# Patient Record
Sex: Female | Born: 1970
Health system: Southern US, Community
[De-identification: ages and names within clinical notes are randomized; demographics above are authoritative.]

## PROBLEM LIST (undated history)

## (undated) DIAGNOSIS — D649 Anemia, unspecified: Secondary | ICD-10-CM

## (undated) DIAGNOSIS — Z8489 Family history of other specified conditions: Secondary | ICD-10-CM

## (undated) DIAGNOSIS — R112 Nausea with vomiting, unspecified: Secondary | ICD-10-CM

## (undated) DIAGNOSIS — C649 Malignant neoplasm of unspecified kidney, except renal pelvis: Secondary | ICD-10-CM

## (undated) DIAGNOSIS — Z9889 Other specified postprocedural states: Secondary | ICD-10-CM

## (undated) DIAGNOSIS — N926 Irregular menstruation, unspecified: Secondary | ICD-10-CM

## (undated) DIAGNOSIS — N9489 Other specified conditions associated with female genital organs and menstrual cycle: Secondary | ICD-10-CM

## (undated) DIAGNOSIS — K219 Gastro-esophageal reflux disease without esophagitis: Secondary | ICD-10-CM

## (undated) DIAGNOSIS — Z8679 Personal history of other diseases of the circulatory system: Secondary | ICD-10-CM

## (undated) DIAGNOSIS — J342 Deviated nasal septum: Secondary | ICD-10-CM

## (undated) DIAGNOSIS — H269 Unspecified cataract: Secondary | ICD-10-CM

## (undated) DIAGNOSIS — D179 Benign lipomatous neoplasm, unspecified: Secondary | ICD-10-CM

## (undated) DIAGNOSIS — Z87898 Personal history of other specified conditions: Secondary | ICD-10-CM

## (undated) DIAGNOSIS — F419 Anxiety disorder, unspecified: Secondary | ICD-10-CM

## (undated) DIAGNOSIS — D250 Submucous leiomyoma of uterus: Secondary | ICD-10-CM

## (undated) HISTORY — DX: Submucous leiomyoma of uterus: D25.0

## (undated) HISTORY — DX: Personal history of other specified conditions: Z87.898

## (undated) HISTORY — DX: Malignant neoplasm of unspecified kidney, except renal pelvis: C64.9

---
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
## 1995-08-19 HISTORY — PX: RHINOPLASTY: SUR1284

## 2000-12-11 ENCOUNTER — Other Ambulatory Visit: Admission: RE | Admit: 2000-12-11 | Discharge: 2000-12-11 | Payer: Self-pay | Admitting: *Deleted

## 2002-01-07 ENCOUNTER — Other Ambulatory Visit: Admission: RE | Admit: 2002-01-07 | Discharge: 2002-01-07 | Payer: Self-pay | Admitting: *Deleted

## 2003-02-10 ENCOUNTER — Other Ambulatory Visit: Admission: RE | Admit: 2003-02-10 | Discharge: 2003-02-10 | Payer: Self-pay | Admitting: *Deleted

## 2004-02-28 ENCOUNTER — Other Ambulatory Visit: Admission: RE | Admit: 2004-02-28 | Discharge: 2004-02-28 | Payer: Self-pay | Admitting: *Deleted

## 2005-01-23 HISTORY — PX: OTHER SURGICAL HISTORY: SHX169

## 2005-03-28 ENCOUNTER — Other Ambulatory Visit: Admission: RE | Admit: 2005-03-28 | Discharge: 2005-03-28 | Payer: Self-pay | Admitting: *Deleted

## 2006-04-24 ENCOUNTER — Other Ambulatory Visit: Admission: RE | Admit: 2006-04-24 | Discharge: 2006-04-24 | Payer: Self-pay | Admitting: Obstetrics and Gynecology

## 2007-06-29 ENCOUNTER — Other Ambulatory Visit: Admission: RE | Admit: 2007-06-29 | Discharge: 2007-06-29 | Payer: Self-pay | Admitting: Obstetrics & Gynecology

## 2008-07-07 ENCOUNTER — Other Ambulatory Visit: Admission: RE | Admit: 2008-07-07 | Discharge: 2008-07-07 | Payer: Self-pay | Admitting: Obstetrics & Gynecology

## 2010-08-18 HISTORY — PX: CATARACT EXTRACTION: SUR2

## 2012-06-17 LAB — HM PAP SMEAR

## 2012-12-08 ENCOUNTER — Telehealth: Payer: Self-pay | Admitting: Obstetrics & Gynecology

## 2012-12-08 NOTE — Telephone Encounter (Signed)
Patient given another 3 pack of LO LOESTRIN FE to try . States is only having a dark brown discharge off and on and wants to make sure this will go away before purchasing at pharmacy. Instructed patient on good hygiene of vaginal area. Samples of LO LOESTRIN FE PLACED  Up front to be picked up.

## 2012-12-08 NOTE — Telephone Encounter (Signed)
Patient had samples of Lo Loestrin FE/iwant to speak with nurse re: side effects before getting an rx//[facility] [address]

## 2013-01-13 ENCOUNTER — Encounter: Payer: Self-pay | Admitting: Obstetrics & Gynecology

## 2013-01-13 ENCOUNTER — Ambulatory Visit (INDEPENDENT_AMBULATORY_CARE_PROVIDER_SITE_OTHER): Payer: BC Managed Care – PPO | Admitting: Obstetrics & Gynecology

## 2013-01-13 VITALS — BP 118/72 | HR 74 | Resp 16 | Ht 60.0 in | Wt 120.0 lb

## 2013-01-13 DIAGNOSIS — Z0000 Encounter for general adult medical examination without abnormal findings: Secondary | ICD-10-CM

## 2013-01-13 DIAGNOSIS — Z124 Encounter for screening for malignant neoplasm of cervix: Secondary | ICD-10-CM

## 2013-01-13 DIAGNOSIS — Z01419 Encounter for gynecological examination (general) (routine) without abnormal findings: Secondary | ICD-10-CM | POA: Insufficient documentation

## 2013-01-13 LAB — POCT URINALYSIS DIPSTICK
Bilirubin, UA: NEGATIVE
Blood, UA: NEGATIVE
Nitrite, UA: NEGATIVE
pH, UA: 6

## 2013-01-13 MED ORDER — NORETHIN ACE-ETH ESTRAD-FE 1-20 MG-MCG PO TABS: 1.0000 | ORAL_TABLET | Freq: Every day | ORAL | Status: DC

## 2013-01-13 NOTE — Patient Instructions (Signed)

## 2013-01-13 NOTE — Progress Notes (Signed)
42 y.o. G0P0000 SingleCaucasianF here for annual exam.  Back together with ex-boyfriend.  Came in during January because she was having bleeding with intercourse.  STD testing was all negative.  Treated with doxycyline.  This didn't help so she ultimately started LoLoestrin.  Heavy bleeding has stopped but she has a fair amount of irregular spotting.  Only needs a light day pad when has spotting.  No bleeding, now, with intercourse.  No pain.  Does feel more dry with intercourse and feels like libido is decreased from the past.  This seems to have changed with the OCP she is taking.  Also wants to talk about anxiety she has experience for "forever".  She doesn't really want to be on any medication.  Questions is any herbal medication might help or therapy?  Patient's last menstrual period was 12/30/2012.          Sexually active: yes  The current method of family planning is oral progesterone-only contraceptive.    Exercising: no  Gym/ health club routine includes cardio. Smoker:  no  Health Maintenance: Pap:  06/17/12 History of abnormal Pap:  Yes MMG:  n/a Colonoscopy: n/a BMD:  n/a TDaP:  10/29/2012 Screening Labs: 14.2, Hb today: normal, Urine today: neg   reports that she has never smoked. She has never used smokeless tobacco. She reports that  drinks alcohol. She reports that she does not use illicit drugs.  Past Medical History  Diagnosis Date  . History of abnormal Pap smear 2/93    LEEP, CIN I    Past Surgical History  Procedure Laterality Date  . Leep  1993    CIN I  . Cataract extraction Left   . Colposcopy    . Cervical biopsy  w/ loop electrode excision      Current Outpatient Prescriptions  Medication Sig Dispense Refill  . Cholecalciferol (VITAMIN D PO) Take by mouth.      . Cyanocobalamin (B-12 PO) Take by mouth.      . Multiple Vitamin (MULTIVITAMIN) tablet Take 1 tablet by mouth daily.      . norethindrone-ethinyl estradiol (JUNEL FE,GILDESS FE,LOESTRIN FE)  1-20 MG-MCG tablet Take 1 tablet by mouth daily.       No current facility-administered medications for this visit.    Family History  Problem Relation Age of Onset  . Breast cancer Maternal Aunt   . Breast cancer Cousin     maternal  . Colon cancer Mother 64  . Colon cancer Maternal Grandmother   . Colon cancer Maternal Grandfather   . Cancer Maternal Uncle   . Cancer Paternal Grandfather     ROS:  Pertinent items are noted in HPI.  Otherwise, a comprehensive ROS was negative.  Exam:   BP 118/72  Pulse 74  Resp 16  Ht 5' (1.524 m)  Wt 120 lb (54.432 kg)  BMI 23.44 kg/m2  LMP 12/30/2012  Weight change: 5lbs Height:   Height: 5' (152.4 cm)  Ht Readings from Last 3 Encounters:  01/13/13 5' (1.524 m)    General appearance: alert, cooperative and appears stated age Head: Normocephalic, without obvious abnormality, atraumatic Neck: no adenopathy, supple, symmetrical, trachea midline and thyroid normal to inspection and palpation Lungs: clear to auscultation bilaterally Breasts: normal appearance, no masses or tenderness Heart: regular rate and rhythm Abdomen: soft, non-tender; bowel sounds normal; no masses,  no organomegaly Extremities: extremities normal, atraumatic, no cyanosis or edema Skin: Skin color, texture, turgor normal. No rashes or lesions Lymph nodes: Cervical,  supraclavicular, and axillary nodes normal. No abnormal inguinal nodes palpated Neurologic: Grossly normal   Pelvic: External genitalia:  no lesions              Urethra:  normal appearing urethra with no masses, tenderness or lesions              Bartholins and Skenes: normal                 Vagina: normal appearing vagina with normal color and discharge, no lesions              Cervix: no lesions              Pap taken: yes Bimanual Exam:  Uterus:  normal size, contour, position, consistency, mobility, non-tender              Adnexa: normal adnexa and no mass, fullness, tenderness                Rectovaginal: Confirms               Anus:  normal sphincter tone, no lesions  A:  Well Woman with normal exam H/o menorrhagia, improved with OPC, now spotting Strong family hx of colon cancer (mother age 47 and several other family members) Anxiety  H/O ASCUS pap with +HR HPV one year ago  P:   Mammogram starting at age 34.  Information given. pap smear with HR HPV obtained. Pt and I discussed St John's Wort vs therapy vs SSRI.  She will start with herbal medication. Change OCP to loestin 1/20.  Reviewed risks including DVT/PE with patient. return annually or prn  An After Visit Summary was printed and given to the patient.

## 2013-01-14 ENCOUNTER — Telehealth: Payer: Self-pay | Admitting: Obstetrics & Gynecology

## 2013-01-14 NOTE — Telephone Encounter (Signed)
RX Microgestin FE for patient costs nothing, no copay. Patient states Dr. Hyacinth Meeker needed this information and asked her to call.

## 2013-01-19 LAB — IPS PAP TEST WITH HPV

## 2013-01-24 ENCOUNTER — Telehealth: Payer: Self-pay

## 2013-01-24 NOTE — Telephone Encounter (Signed)
6/9 lmtcb//kn 

## 2013-01-24 NOTE — Telephone Encounter (Signed)
Patient notified of all results. 

## 2013-06-18 HISTORY — PX: TONSILLECTOMY: SUR1361

## 2014-02-10 ENCOUNTER — Ambulatory Visit (INDEPENDENT_AMBULATORY_CARE_PROVIDER_SITE_OTHER): Payer: BC Managed Care – PPO | Admitting: Obstetrics & Gynecology

## 2014-02-10 ENCOUNTER — Encounter: Payer: Self-pay | Admitting: Obstetrics & Gynecology

## 2014-02-10 VITALS — BP 104/60 | HR 68 | Resp 16 | Ht 60.25 in | Wt 121.0 lb

## 2014-02-10 DIAGNOSIS — Z124 Encounter for screening for malignant neoplasm of cervix: Secondary | ICD-10-CM

## 2014-02-10 DIAGNOSIS — N925 Other specified irregular menstruation: Secondary | ICD-10-CM

## 2014-02-10 DIAGNOSIS — N938 Other specified abnormal uterine and vaginal bleeding: Secondary | ICD-10-CM

## 2014-02-10 DIAGNOSIS — Z0000 Encounter for general adult medical examination without abnormal findings: Secondary | ICD-10-CM

## 2014-02-10 DIAGNOSIS — Z01419 Encounter for gynecological examination (general) (routine) without abnormal findings: Secondary | ICD-10-CM

## 2014-02-10 DIAGNOSIS — Z202 Contact with and (suspected) exposure to infections with a predominantly sexual mode of transmission: Secondary | ICD-10-CM

## 2014-02-10 DIAGNOSIS — N949 Unspecified condition associated with female genital organs and menstrual cycle: Secondary | ICD-10-CM

## 2014-02-10 DIAGNOSIS — R87610 Atypical squamous cells of undetermined significance on cytologic smear of cervix (ASC-US): Secondary | ICD-10-CM

## 2014-02-10 LAB — CBC
HEMATOCRIT: 40.9 % (ref 36.0–46.0)
Hemoglobin: 14.3 g/dL (ref 12.0–15.0)
MCH: 31.6 pg (ref 26.0–34.0)
MCHC: 35 g/dL (ref 30.0–36.0)
MCV: 90.3 fL (ref 78.0–100.0)
Platelets: 241 10*3/uL (ref 150–400)
RBC: 4.53 MIL/uL (ref 3.87–5.11)
RDW: 13.7 % (ref 11.5–15.5)
WBC: 9.2 10*3/uL (ref 4.0–10.5)

## 2014-02-10 LAB — LIPID PANEL
Cholesterol: 219 mg/dL — ABNORMAL HIGH (ref 0–200)
HDL: 80 mg/dL (ref >39)
LDL CALC: 123 mg/dL — AB (ref 0–99)
TRIGLYCERIDES: 79 mg/dL (ref <150)
Total CHOL/HDL Ratio: 2.7 Ratio
VLDL: 16 mg/dL (ref 0–40)

## 2014-02-10 LAB — POCT URINALYSIS DIPSTICK
Bilirubin, UA: NEGATIVE
Glucose, UA: NEGATIVE
Ketones, UA: NEGATIVE
Leukocytes, UA: NEGATIVE
Nitrite, UA: NEGATIVE
PROTEIN UA: NEGATIVE
RBC UA: NEGATIVE
UROBILINOGEN UA: NEGATIVE
pH, UA: 5

## 2014-02-10 MED ORDER — FLUCONAZOLE 150 MG PO TABS: 150.0000 mg | ORAL_TABLET | Freq: Once | ORAL | Status: DC

## 2014-02-10 NOTE — Patient Instructions (Signed)

## 2014-02-10 NOTE — Progress Notes (Signed)
43 y.o. G0P0000 SingleCaucasianF here for annual exam.  Significant other moved to [place] and they broke up.  Not dating anyone or SA right now.  He moved six weeks ago.  Stopped OCPs last year after she had tonsillectomy--November 2014.  Now having a little spotting.  Flow is a week with some spotting.  When on OCP, flow was was shorter.  Spotting stopped.  Feels better off pills.  Not sure if she wants to restart.   Patient's last menstrual period was 02/02/2014.          Sexually active: Yes.    The current method of family planning is condoms 100% of the time.    Exercising: Yes.    strength training and cardio Smoker:  no  Health Maintenance: Pap:  01/13/13 WNL/negative HR HPV History of abnormal Pap:  Yes h/o CIN I/II. 1993 MMG:  none Colonoscopy:  none BMD:   none TDaP:  2008 will check with PCP to be sure Screening Labs: today, Hb today: today  Urine today: negative   reports that she has never smoked. She has never used smokeless tobacco. She reports that she drinks about 1 - 1.5 ounces of alcohol per week. She reports that she does not use illicit drugs.  Past Medical History  Diagnosis Date  . History of abnormal Pap smear 2/93    LEEP, CIN I    Past Surgical History  Procedure Laterality Date  . Leep  1993    CIN I  . Cataract extraction Left 2012  . Tonsillectomy  11/14    Current Outpatient Prescriptions  Medication Sig Dispense Refill  . Cholecalciferol (VITAMIN D PO) Take by mouth.      . Cyanocobalamin (B-12 PO) Take by mouth.      . Multiple Vitamin (MULTIVITAMIN) tablet Take 1 tablet by mouth daily.      . norethindrone-ethinyl estradiol (JUNEL FE,GILDESS FE,LOESTRIN FE) 1-20 MG-MCG tablet Take 1 tablet by mouth daily.  1 Package  13   No current facility-administered medications for this visit.    Family History  Problem Relation Age of Onset  . Breast cancer Maternal Aunt   . Breast cancer Cousin     maternal  . Colon cancer Mother 64  . Colon  cancer Maternal Grandmother   . Colon cancer Maternal Grandfather   . Colon cancer Maternal Uncle   . Lung cancer Paternal Grandfather     ROS:  Pertinent items are noted in HPI.  Otherwise, a comprehensive ROS was negative.  Exam:   BP 104/60  Pulse 68  Resp 16  Ht 5' 0.25" (1.53 m)  Wt 121 lb (54.885 kg)  BMI 23.45 kg/m2  LMP 02/02/2014  Weight change: -1#   Height: 5' 0.25" (153 cm)  Ht Readings from Last 3 Encounters:  02/10/14 5' 0.25" (1.53 m)  01/13/13 5' (1.524 m)    General appearance: alert, cooperative and appears stated age Head: Normocephalic, without obvious abnormality, atraumatic Neck: no adenopathy, supple, symmetrical, trachea midline and thyroid normal to inspection and palpation Lungs: clear to auscultation bilaterally Breasts: normal appearance, no masses or tenderness Heart: regular rate and rhythm Abdomen: soft, non-tender; bowel sounds normal; no masses,  no organomegaly Extremities: extremities normal, atraumatic, no cyanosis or edema Skin: Skin color, texture, turgor normal. No rashes or lesions Lymph nodes: Cervical, supraclavicular, and axillary nodes normal. No abnormal inguinal nodes palpated Neurologic: Grossly normal   Pelvic: External genitalia:  no lesions  Urethra:  normal appearing urethra with no masses, tenderness or lesions              Bartholins and Skenes: normal                 Vagina: normal appearing vagina with normal color and discharge, no lesions              Cervix: no lesions              Pap taken: Yes.   Bimanual Exam:  Uterus:  normal size, contour, position, consistency, mobility, non-tender              Adnexa: normal adnexa and no mass, fullness, tenderness               Rectovaginal: Confirms               Anus:  normal sphincter tone, no lesions  A:  Well Woman with normal exam  H/o menorrhagia improved with OCPs.  Off since 11/14. Strong family hx of colon cancer (mother age 57 and several other  family members)  Anxiety  H/O ASCUS pap with +HR HPV 2013.  Neg pap with neg HR HPV 2014. Recurrent yeast due to exercise   P: Mammogram starting at age 77. Information given.  pap smear only today with GC/Chl off pap CBC, lipids, CMP, TSH, Vit D Plan SHGM right after cycle Diflucan 150mg  po x 1, repeat 72.  #2/3RF for recurrent yeast infection. return annually or prn  An After Visit Summary was printed and given to the patient.

## 2014-02-11 LAB — VITAMIN D 25 HYDROXY (VIT D DEFICIENCY, FRACTURES): Vit D, 25-Hydroxy: 65 ng/mL (ref 30–89)

## 2014-02-11 LAB — TSH: TSH: 1.439 u[IU]/mL (ref 0.350–4.500)

## 2014-02-13 LAB — THYROID PEROXIDASE ANTIBODY

## 2014-02-14 ENCOUNTER — Telehealth: Payer: Self-pay

## 2014-02-14 LAB — IPS PAP SMEAR ONLY

## 2014-02-14 LAB — IPS N GONORRHOEA AND CHLAMYDIA BY PCR

## 2014-02-14 NOTE — Telephone Encounter (Signed)
Message copied by Robley Fries on Tue Feb 14, 2014  9:19 AM ------      Message from: Megan Salon      Created: Mon Feb 13, 2014  6:07 PM       Inform cholestrol is mildly elevated and LDLs are as well but ratio is fine and HDLs are great.  Thyroid is fine.  Vit D normal.  CBC normal. ------

## 2014-02-14 NOTE — Telephone Encounter (Signed)
Patient returning Kelly's call. °

## 2014-02-14 NOTE — Telephone Encounter (Signed)
Patient notified of all results.//kn 

## 2014-02-14 NOTE — Telephone Encounter (Signed)
Lmtcb//kn 

## 2014-02-15 ENCOUNTER — Telehealth: Payer: Self-pay | Admitting: Obstetrics & Gynecology

## 2014-02-15 NOTE — Telephone Encounter (Signed)
Left message for patient to call back. Need to go over benefits and schedule SHGM. °

## 2014-02-15 NOTE — Telephone Encounter (Signed)
Spoke with patient. She is to schedule Sonohysterogram with Dr. Sabra Heck for directly after cycle.  She is not currently on birth control.  She plans to start her next cycle around 7/20. She states she usually has some light spotting and then a flow starts. Advised patient to call with first bleeding and then we can schedule for Sonohysterogram with Dr. Sabra Heck.  She is agreeable to this.  She is given information from [place] regarding her responsibility for cost to be collected at time of appointment is $20.00.  She will call with first day of menses and can be scheduled for Sonohysterogram with Dr. Sabra Heck.   Will wait for patient to return call for scheduling.  Will close encounter.

## 2014-02-15 NOTE — Addendum Note (Signed)
Addended by: Megan Salon on: 02/15/2014 06:38 AM   Modules accepted: Orders

## 2014-02-16 LAB — IPS HPV ON A LIQUID BASED SPECIMEN

## 2014-02-20 ENCOUNTER — Telehealth: Payer: Self-pay

## 2014-02-20 NOTE — Telephone Encounter (Signed)
Message copied by Robley Fries on Mon Feb 20, 2014 11:20 AM ------      Message from: Megan Salon      Created: Mon Feb 20, 2014  6:13 AM       Inform pap ASCUS with neg HR HPV.  This is regarded as normal.  No colposcopy needed.  Repeat one year.  02 recall.  GC/CHL normal. ------

## 2014-02-20 NOTE — Telephone Encounter (Signed)
Lmtcb//kn 

## 2014-02-21 NOTE — Telephone Encounter (Signed)
Patient notified of results.//kn 

## 2014-02-28 ENCOUNTER — Telehealth: Payer: Self-pay | Admitting: Obstetrics & Gynecology

## 2014-02-28 DIAGNOSIS — N938 Other specified abnormal uterine and vaginal bleeding: Secondary | ICD-10-CM

## 2014-02-28 NOTE — Telephone Encounter (Signed)
Spoke with patient. She started her cycle on 02/27/14 and thinks she will be done bleeding by 03/02/14. Really would like to have this Thursday appointment as this will work out for her best because she is planning to be off.  I advised that I have sent a message to Dr. Sabra Heck to ensure that this date will be okay for Sonohysterogram. I would like to ensure this is okay with Dr. Sabra Heck. Patient is scheduled for 03/02/14 at 1130 and advised would call back to advise if okay to keep appointment or need to r/s according to Dr. Sabra Heck.  Okay to use 1130 SHGM per Lamont Snowball.

## 2014-02-28 NOTE — Telephone Encounter (Signed)
That's fine with me. Thanks

## 2014-02-28 NOTE — Telephone Encounter (Signed)
Detailed message left to advise patient of message from Dr. Sabra Heck. Detailed message okay per designated party release form.  Advised will need 72 hours notice prior to cancellation. Within that time frame now.  pre-procedure instructions given. Motrin instructions given. Motrin=Advil=Ibuprofen Can take 800 mg (Can purchase over the counter, you will need four 200 mg pills). Take with food. Make sure to eat a meal and drink fluids prior to appointment.

## 2014-02-28 NOTE — Telephone Encounter (Signed)
Patient calling to let us know she started her cycle yesterday so we can schedule her PUS

## 2014-02-28 NOTE — Telephone Encounter (Addendum)
Returning call to patient.  Note from Dr. Sabra Heck from 02/10/14: Plan SHGM right after cycle Will confirm LMP and schedule SHGM according to cycle.   Dr. Sabra Heck, if was 02/27/14, do you want patient to have [facility] when you are in office after cycle in August?

## 2014-03-02 ENCOUNTER — Encounter: Payer: Self-pay | Admitting: Obstetrics & Gynecology

## 2014-03-02 ENCOUNTER — Ambulatory Visit (INDEPENDENT_AMBULATORY_CARE_PROVIDER_SITE_OTHER): Payer: BC Managed Care – PPO

## 2014-03-02 ENCOUNTER — Other Ambulatory Visit: Payer: Self-pay | Admitting: Obstetrics & Gynecology

## 2014-03-02 ENCOUNTER — Ambulatory Visit (INDEPENDENT_AMBULATORY_CARE_PROVIDER_SITE_OTHER): Payer: BC Managed Care – PPO | Admitting: Obstetrics & Gynecology

## 2014-03-02 VITALS — BP 112/74 | HR 68 | Ht 60.0 in | Wt 119.0 lb

## 2014-03-02 DIAGNOSIS — N925 Other specified irregular menstruation: Secondary | ICD-10-CM

## 2014-03-02 DIAGNOSIS — N938 Other specified abnormal uterine and vaginal bleeding: Secondary | ICD-10-CM

## 2014-03-02 DIAGNOSIS — N949 Unspecified condition associated with female genital organs and menstrual cycle: Secondary | ICD-10-CM

## 2014-03-02 MED ORDER — NORETHINDRONE 0.35 MG PO TABS: 1.0000 | ORAL_TABLET | Freq: Every day | ORAL | Status: DC

## 2014-03-02 NOTE — Progress Notes (Signed)
43 y.o.Singlefemale here for a pelvic ultrasound with sonohystogram due to spotting after her cycle and mid cycle spotting.  When pt in on estrogen containing OCPs, she is fine and cycles very normally.  However, she has a lot of fatigue with OCPs and mild nausea, even if she takes the pills at night.  She would prefer those symptoms over the spotting as it is really annoying.  Here for sonohysterography for further evaluation.  Patient's last menstrual period was 02/27/2014.  Contraception: condoms  Technique:  Both transabdominal and transvaginal ultrasound examinations of the pelvis were performed. Transabdominal technique was performed for global imaging of the pelvis including uterus, ovaries, adnexal regions, and pelvic cul-de-sac.  It was necessary to proceed with endovaginal exam following the abdominal ultrasound transabdominal exam to visualize the endometrium and adnexa.  Color and duplex Doppler ultrasound was utilized to evaluate blood flow to the ovaries.    FINDINGS: Uterus: 6.2 x 4.2 x 4.0cm Endometrium: 4.7mm Adnexa:  Left: 3.0 x 2.1 x 1.2cm     Right: 3.2 x 2.4 x 1.7cm Cul de sac:  No free fluid  SHSG:  After obtaining appropriate verbal consent from patient, the cervix was visualized using a speculum, and prepped with betadine.  A tenaculum  was applied to the cervix.  Dilation of the cervix was not necessary. The catheter was passed into the uterus and sterile saline introduced, with the following findings: no intracavitary lesions  Images reviewed with pt.  I was really expecting a polyp to be present but there really doesn't appear to be one.  Feel hysteroscopy and D&C is excessive for current problem.  D/W pt trial of progesterone only pills.  Adminstration and side effects d/w pt.  She is aware will need three month adjustment period.  Also aware amenorrhea is possible side effect.  She would be fine with this.  All questions answered.    Assessment:  DUB, with spotting,  o/w regular cycles, normal shgm today  Plan:  Trial of micronor.  Rx to pharmacy.  Pt will call with any concerns or side effects.  ~25 minutes spent with patient >50% of time was in face to face discussion of above.

## 2014-03-02 NOTE — Addendum Note (Signed)
Addended by: Michele Mcalpine on: 03/02/2014 10:51 AM   Modules accepted: Orders

## 2014-03-22 ENCOUNTER — Telehealth: Payer: Self-pay | Admitting: Obstetrics & Gynecology

## 2014-03-22 ENCOUNTER — Encounter: Payer: Self-pay | Admitting: Gynecology

## 2014-03-22 ENCOUNTER — Ambulatory Visit (INDEPENDENT_AMBULATORY_CARE_PROVIDER_SITE_OTHER): Payer: BC Managed Care – PPO | Admitting: Gynecology

## 2014-03-22 VITALS — BP 118/68 | HR 80 | Ht 60.25 in | Wt 121.0 lb

## 2014-03-22 DIAGNOSIS — Z800 Family history of malignant neoplasm of digestive organs: Secondary | ICD-10-CM

## 2014-03-22 DIAGNOSIS — Z1239 Encounter for other screening for malignant neoplasm of breast: Secondary | ICD-10-CM

## 2014-03-22 DIAGNOSIS — Z803 Family history of malignant neoplasm of breast: Secondary | ICD-10-CM

## 2014-03-22 DIAGNOSIS — R222 Localized swelling, mass and lump, trunk: Secondary | ICD-10-CM

## 2014-03-22 NOTE — Telephone Encounter (Signed)
Tried to reach patient at work number provided but was unable to get through. Tried to reach patient at cell phone number provided but there was no answer and the voice mailbox is full at this time. Will try to reach patient again.

## 2014-03-22 NOTE — Telephone Encounter (Signed)
Returning a call to Barry. Please call work number.

## 2014-03-22 NOTE — Telephone Encounter (Signed)
Pt has a swollen lymph node under her collar bone on the left side of her breast. She would like to come in today to be seen.

## 2014-03-22 NOTE — Telephone Encounter (Signed)
Spoke with patient. Advised spoke with Dr.Lathrop and patient can be worked in today at 1:45pm. Patient agreeable to date and time.  Routing to Dr.Lathrop as covering Cc: Dr.Miller  Routing to provider for final review. Patient agreeable to disposition. Will close encounter

## 2014-03-22 NOTE — Progress Notes (Signed)
Ordered from Dr. Charlies Constable for Bilateral dx MMG (patient has not had baseline) and L Breast Ultrasound. Patient has Mass on L Chest wall near clavicle. Spoke with Ena Dawley at [facility] She states that a screening will need to be ordered and an ultrasound done at [facility].  Discussed with Dr. Karle Starch received. L Chest wall ultrasound at [facility] [place]. [address]  3D Screening Mammogram at [facility].   Patient scheduled for L Chest Wall Ultrasound at [facility] for 03/24/14.  Patient scheduled for Bilateral 3D screening Mammogram at [facility] for 03/31/14. She is notified of $50.00 cost for 3D Mammogram.

## 2014-03-22 NOTE — Progress Notes (Signed)
Subjective:     Patient ID: Sarah George, female   DOB: 17-May-1971, 43 y.o.   MRN: [id number]  HPI Comments: Pt here after having a mass on left "breast" noted during a massage.  Pt does regular breast exams.  Pt has never had a mammogram.  Pt has a strong family history of breast and colon cancer on maternal side.  No genetic testing.  Materna cousin died in 56's.    Review of Systems Per HPI    Objective:   Physical Exam  Nursing note and vitals reviewed. Constitutional: She is oriented to person, place, and time. She appears well-developed and well-nourished.  Pulmonary/Chest: Right breast exhibits no inverted nipple, no mass, no nipple discharge, no skin change and no tenderness. Left breast exhibits no inverted nipple, no mass, no nipple discharge, no skin change and no tenderness.    Neurological: She is alert and oriented to person, place, and time.  Skin: Skin is warm and dry.       Assessment:     Left chest wall lesion-probable lipoma Strong family history of breast/colon cancer     Plan:     Will get bilateral 3D mammogram-dense breast tissue U/s of area of concern-superior to breast tissue To consider genetic testing, may affect screening protocols for her-pt agrees      43m spent counseling re cancer risks, >50% face to face

## 2014-03-22 NOTE — Telephone Encounter (Signed)
Spoke with patient. Patient states that last Friday she was getting a massage and it was noticed that she has a swollen area below her collar bone that extends close to her breast. "It feels like there is fluid in there or something." Patient denies pain. Area has not grown in size since last Friday but has not decreased in size either. Patient states she has not had a mammogram yet since turning 42. Patient would like to come in to be seen today for evaluation to "makre sure everything is okay." Advised patient would speak with one of the covering providers and see what the best date and time would be to get her in for evaluation. Patient is agreeable and would like return call to work number.

## 2014-03-24 ENCOUNTER — Ambulatory Visit
Admission: RE | Admit: 2014-03-24 | Discharge: 2014-03-24 | Disposition: A | Discharge status: Home / Self Care | Payer: BC Managed Care – PPO | Source: Ambulatory Visit | Attending: Gynecology | Admitting: Gynecology

## 2014-03-24 ENCOUNTER — Telehealth: Payer: Self-pay | Admitting: *Deleted

## 2014-03-24 DIAGNOSIS — D179 Benign lipomatous neoplasm, unspecified: Secondary | ICD-10-CM

## 2014-03-24 DIAGNOSIS — R222 Localized swelling, mass and lump, trunk: Secondary | ICD-10-CM

## 2014-03-24 HISTORY — DX: Benign lipomatous neoplasm, unspecified: D17.9

## 2014-03-24 NOTE — Telephone Encounter (Signed)
Patient notified see result note 

## 2014-03-24 NOTE — Telephone Encounter (Signed)
Message copied by Alfonzo Feller on Fri Mar 24, 2014  4:01 PM ------      Message from: Elveria Rising      Created: Fri Mar 24, 2014  3:50 PM       Inform lipoma as suspected on chest wall ------

## 2014-03-27 ENCOUNTER — Ambulatory Visit
Admission: RE | Admit: 2014-03-27 | Discharge: 2014-03-27 | Disposition: A | Discharge status: Home / Self Care | Payer: BC Managed Care – PPO | Source: Ambulatory Visit | Attending: Gynecology | Admitting: Gynecology

## 2014-03-27 DIAGNOSIS — Z1239 Encounter for other screening for malignant neoplasm of breast: Secondary | ICD-10-CM

## 2014-03-31 ENCOUNTER — Ambulatory Visit: Payer: Self-pay

## 2014-04-05 ENCOUNTER — Telehealth: Payer: Self-pay | Admitting: Obstetrics & Gynecology

## 2014-04-05 NOTE — Telephone Encounter (Signed)
Call to patient to advise that she is scheduled for genetic testing at [facility] on Wed Aug 26 @ 1230. No voicemail available

## 2014-04-10 NOTE — Telephone Encounter (Signed)
Call to patient to advise that she is scheduled for genetic testing at [facility] on Wed Aug 26 @ 1230. No voicemail available

## 2014-04-12 ENCOUNTER — Other Ambulatory Visit: Payer: BC Managed Care – PPO

## 2014-04-13 NOTE — Telephone Encounter (Signed)
Spoke with patient. She is having frequent urination and feels a lot of pressure in lower pelvic area with bloating x 2 days. Patient wondering if this may be uti as she has not had one since she was a teenager.  She has been traveling all week and is on her way to [place]. Patient denies dysuria, fevers, flank pain, nausea and vomiting.  Advised patient that she will need to be seen at local urgent care for evaluation as as will need to have a urinalysis to evaluate for uti and possible urine culture. Advised to increase fluids. Patient states she is on new ocp x 1 month and feeling bloating with new ocp. Patient feels she may watch symptoms and seek evaluation if symptoms change. Advised patient if symptoms increase to not wait to seek care, to be seen out of town. Patient verbalized understanding of instructions and will call back as needed.  Routing to provider for final review. Patient agreeable to disposition. Will close encounter

## 2014-04-13 NOTE — Telephone Encounter (Signed)
Pt calling saying she thinks she has a UTI. Symptoms are frequent urination,  pressure in lower abdomin, and bloated. Pt is out of town until Monday. Wants to know if we would call her in a medication. Advised pt that we normally would need pt to come in for an appt to correctly diagnose to give right meds. Pt agreeable but still wanted me to send message to see. Pt says this is not reoccurring has not had one since she was a teenager.

## 2014-05-17 ENCOUNTER — Telehealth: Payer: Self-pay | Admitting: Obstetrics & Gynecology

## 2014-05-17 NOTE — Telephone Encounter (Signed)
Attempted to reach patient at mobile number provided. There was no answer and the mailbox is currently full. Will try again.

## 2014-05-17 NOTE — Telephone Encounter (Signed)
Pt has a question regarding her birth control.

## 2014-05-18 NOTE — Telephone Encounter (Signed)
I would suggest LoLoEstrin.  (Same as the Microgestin just lower dose.) OK to refill until annual exam due in July 2015.  I saw that she has nausea when she takes combined oral contraception, so lowering the dose of the pill with estrogen may really help this. If patient is currently on her cycle, have her just switch to the new pills. Use back up protection for the first month.  If patient wants to go back on her prior dosage of Microgestin, let me know.

## 2014-05-18 NOTE — Telephone Encounter (Signed)
Spoke with patient. Patient states that she is currently in her third month of taking micronor and is about half way through. Patient states that since the second month she has been getting acne and is currently bleeding. Patient would like to swtich back to microgestin which she states she was previously on after loestrin. "I would rather be on something that doesn't cause me to have acne.I am open to suggestions." Advised patient would send a message over to covering provider as Dr.Miller is out of the office today and give her a call back with further recommendations and instructions. Patient is agreeable. Patient would also like clarification if she is going to switch OCP and is currently on her cycle if she could just switch over or if she needs to complete pack. Advised would need to speak with provider first about switching before any further instructions can be given. Patient is agreeable.

## 2014-05-19 MED ORDER — NORETHIN-ETH ESTRAD-FE BIPHAS 1 MG-10 MCG / 10 MCG PO TABS: 1.0000 | ORAL_TABLET | Freq: Every day | ORAL | Status: DC

## 2014-05-19 NOTE — Telephone Encounter (Signed)
Left message to call Kaitlyn at [phone]. 

## 2014-05-19 NOTE — Telephone Encounter (Signed)
Spoke with patient. Patient is agreeable to start on the Loloestrin at this time. Patient is currently on cycle and will begin new pill today will use BUM for first month. Rx placed for Loloestrin to pharmacy on file for #3 2RF.  Routing to provider for final review. Patient agreeable to disposition. Will close encounter

## 2014-07-26 ENCOUNTER — Telehealth: Payer: Self-pay | Admitting: Emergency Medicine

## 2014-07-26 NOTE — Telephone Encounter (Signed)
-----   Message from Elveria Rising, MD sent at 05/29/2014  8:12 AM EDT ----- Regarding: RE: Mammogram hold  yes ----- Message -----    From: Karen Chafe, RN    Sent: 05/22/2014   3:34 PM      To: Azalia Bilis, MD Subject: Mammogram hold                                 Okay to remove from mammogram hold? Had negative breast imaging and negative chest ultrasound.

## 2014-08-28 ENCOUNTER — Telehealth: Payer: Self-pay | Admitting: Obstetrics & Gynecology

## 2014-08-28 NOTE — Telephone Encounter (Signed)
Spoke with patient. Advised no generic available for Loloestrin Fe. Advised savings cards are available that will make rx no more then $25 with insurance. Patient is agreeable. "I was on a birth control before that was free but really was not working for me. This one has been working so I want to stay on it." Patient will print savings card online and take to pharmacy. Will call with any questions.  Routing to provider for final review. Patient agreeable to disposition. Will close encounter

## 2014-08-28 NOTE — Telephone Encounter (Signed)
Pt is wondering if the loloestrin birth control comes in a generic.

## 2014-09-19 ENCOUNTER — Telehealth: Payer: Self-pay | Admitting: Obstetrics & Gynecology

## 2014-09-19 NOTE — Telephone Encounter (Signed)
Left message to call Kaitlyn at [phone]. 

## 2014-09-19 NOTE — Telephone Encounter (Signed)
Spoke with patient. Patient states that she has been on Loloestrin for 4 months now. "The first two months I had spotting the whole time. Last month was the best month. I had spotting for 5 days and then bleeding for two. This past month was the worst. I had heavy dark brown spotting and I felt terrible. I was so tired and my emotions were all over the place. I just don't feel like this pill is right for me. At one point I was on a birth control that was one step up in estrogen and it worked really well for me." Patient has been on Micronor in the past which she states stopped her break through bleeding but caused her to have acne and bloating. "I think maybe if I went to the Loestrin it may make a difference." Offered patient appointment to discuss birth control with provider but patient declines. Advised would send a message to [place] and return call with further recommendations. Patient is agreeable.

## 2014-09-19 NOTE — Telephone Encounter (Signed)
Pt would like a call to discuss changing type of birth control - a different kind of pill due to breakthrough bleeding and states she'd like one with a little more estrogen.  Pt requests call to work number.

## 2014-09-19 NOTE — Telephone Encounter (Signed)
OK to change to loestrin 1/20.  This is the pill she mentions in her triage note.  OK to RF through [facility]EX and pt can go right into this pack after she finishes current one.  Thanks.

## 2014-09-20 MED ORDER — NORETHINDRONE ACET-ETHINYL EST 1-20 MG-MCG PO TABS: 1.0000 | ORAL_TABLET | Freq: Every day | ORAL | Status: DC

## 2014-09-20 NOTE — Telephone Encounter (Signed)
Spoke with patient. Advised patient of message as seen below from [place]. Patient is agreeable. Prescription sent to pharmacy on file for Loestrin 1/20 #3 1RF until aex. Patient is agreeable.   Routing to provider for final review. Patient agreeable to disposition. Will close encounter

## 2014-09-20 NOTE — Telephone Encounter (Signed)
Left message to call Zannah Melucci at [phone]. 

## 2015-02-23 ENCOUNTER — Ambulatory Visit (INDEPENDENT_AMBULATORY_CARE_PROVIDER_SITE_OTHER): Payer: BLUE CROSS/BLUE SHIELD | Admitting: Obstetrics & Gynecology

## 2015-02-23 ENCOUNTER — Encounter: Payer: Self-pay | Admitting: Obstetrics & Gynecology

## 2015-02-23 VITALS — BP 120/66 | HR 72 | Resp 16 | Ht 59.5 in | Wt 121.0 lb

## 2015-02-23 DIAGNOSIS — Z01419 Encounter for gynecological examination (general) (routine) without abnormal findings: Secondary | ICD-10-CM | POA: Diagnosis not present

## 2015-02-23 DIAGNOSIS — Z0000 Encounter for general adult medical examination without abnormal findings: Secondary | ICD-10-CM

## 2015-02-23 DIAGNOSIS — Z124 Encounter for screening for malignant neoplasm of cervix: Secondary | ICD-10-CM

## 2015-02-23 LAB — POCT URINALYSIS DIPSTICK
Bilirubin, UA: NEGATIVE
Blood, UA: NEGATIVE
Glucose, UA: NEGATIVE
KETONES UA: NEGATIVE
Nitrite, UA: NEGATIVE
PROTEIN UA: NEGATIVE
UROBILINOGEN UA: NEGATIVE
pH, UA: 5

## 2015-02-23 MED ORDER — FLUCONAZOLE 150 MG PO TABS: 150.0000 mg | ORAL_TABLET | Freq: Once | ORAL | Status: DC

## 2015-02-23 MED ORDER — NORETHINDRONE ACET-ETHINYL EST 1-20 MG-MCG PO TABS: 1.0000 | ORAL_TABLET | Freq: Every day | ORAL | Status: DC

## 2015-02-23 NOTE — Progress Notes (Signed)
44 y.o. G0P0000 SingleCaucasianF here for annual exam.  Work is much more stressful in the summer as kids are out of school.  Cycles are regular.  Flow is very light with just brownish spotting with bleeding for two days.  Occasionally just has two days of bleeding without the spotting.    No new sexual partner this past year.  Had STD testing last year.   Patient's last menstrual period was 02/02/2015 (approximate).          Sexually active: Yes.    The current method of family planning is OCP (estrogen/progesterone).    Exercising: Yes.    cardio and strength training Smoker:  no  Health Maintenance: Pap:  02/10/14 ASCUS/negative HR HPV History of abnormal Pap:  yes MMG:  03/27/14-normal Colonoscopy:  none BMD:   none TDaP:  ? 2008 will check with PCP or work Screening Labs: 2015 , Hb today: n/a, Urine today: WBC-trace   reports that she has never smoked. She has never used smokeless tobacco. She reports that she drinks about 0.6 - 1.2 oz of alcohol per week. She reports that she does not use illicit drugs.  Past Medical History  Diagnosis Date  . History of abnormal Pap smear 2/93    LEEP, CIN I    Past Surgical History  Procedure Laterality Date  . Leep  1993    CIN I  . Cataract extraction Left 2012  . Tonsillectomy  11/14    Current Outpatient Prescriptions  Medication Sig Dispense Refill  . Cholecalciferol (VITAMIN D PO) Take 1,000 Int'l Units by mouth.     . Cyanocobalamin (B-12 PO) Take by mouth.    . Multiple Vitamin (MULTIVITAMIN) tablet Take 1 tablet by mouth daily.    . norethindrone-ethinyl estradiol (MICROGESTIN,JUNEL,LOESTRIN) 1-20 MG-MCG tablet Take 1 tablet by mouth daily. 3 Package 1   No current facility-administered medications for this visit.    Family History  Problem Relation Age of Onset  . Breast cancer Maternal Aunt   . Breast cancer Cousin     maternal  . Colon cancer Mother 49  . Colon cancer Maternal Grandmother   . Colon cancer Maternal  Grandfather   . Colon cancer Maternal Uncle   . Lung cancer Paternal Grandfather     ROS:  Pertinent items are noted in HPI.  Otherwise, a comprehensive ROS was negative.  Exam:   General appearance: alert, cooperative and appears stated age Head: Normocephalic, without obvious abnormality, atraumatic Neck: no adenopathy, supple, symmetrical, trachea midline and thyroid normal to inspection and palpation Lungs: clear to auscultation bilaterally Breasts: normal appearance, no masses or tenderness Heart: regular rate and rhythm Abdomen: soft, non-tender; bowel sounds normal; no masses,  no organomegaly Extremities: extremities normal, atraumatic, no cyanosis or edema Skin: Skin color, texture, turgor normal. No rashes or lesions Lymph nodes: Cervical, supraclavicular, and axillary nodes normal. No abnormal inguinal nodes palpated Neurologic: Grossly normal   Pelvic: External genitalia:  no lesions              Urethra:  normal appearing urethra with no masses, tenderness or lesions              Bartholins and Skenes: normal                 Vagina: normal appearing vagina with normal color and discharge, no lesions              Cervix: no lesions  Pap taken: Yes.   Bimanual Exam:  Uterus:  normal size, contour, position, consistency, mobility, non-tender              Adnexa: normal adnexa and no mass, fullness, tenderness               Rectovaginal: Confirms               Anus:  normal sphincter tone, no lesions  Chaperone was present for exam.  A:  Well woman with normal exam  H/o menorrhagia improved with OCP Strong family hx of colon cancer (mother age 39 and several other family members)  Anxiety  H/O ASCUS pap with +HR HPV 2013. Neg pap with neg HR HPV 2014.  ASCUS pap 2015.   H/O recurrent yeast during summer months  P: Mammogram yearly. pap smear with HR HPV testing today Loestrin 1/20 rx to pharmacy.   Diflucan 150mg  po x 1, repeat 48 hrs if needed.   #3RF. Pt will check with PCP regarding tdap return annually or prn

## 2015-02-27 LAB — IPS PAP TEST WITH HPV

## 2015-03-05 ENCOUNTER — Telehealth: Payer: Self-pay

## 2015-03-05 NOTE — Telephone Encounter (Signed)
Lmtcb//kn 

## 2015-03-05 NOTE — Telephone Encounter (Signed)
-----   Message from Megan Salon, MD sent at 02/28/2015  9:43 AM EDT ----- Inform pap negative and HR HPV negative.  Last year ASCUS with neg HR HPV.  2014 neg pap with neg HR HPV.  02 recall.

## 2015-03-12 NOTE — Telephone Encounter (Signed)
Patient notified of results. See result note.//kn

## 2015-11-06 HISTORY — PX: OTHER SURGICAL HISTORY: SHX169

## 2016-04-27 ENCOUNTER — Other Ambulatory Visit: Payer: Self-pay | Admitting: Obstetrics & Gynecology

## 2016-04-28 NOTE — Telephone Encounter (Signed)
Medication refill request: Norethindrone-Ethinyl Estradiol Last AEX:  02/23/15 SM Next AEX: 05/23/16 SM Last MMG (if hormonal medication request): 03/27/14 BIRADS1 Refill authorized: 02/23/15 #3 Packages 4R. Please advise. Thank you.

## 2016-04-29 NOTE — Telephone Encounter (Signed)
Patient called to check on the status of the refill request. She declined to move her appointment up due to her schedule. This patient is completely out of her birth control medication and hopes it will be called in today.

## 2016-04-29 NOTE — Telephone Encounter (Signed)
Dr. Sabra Heck, please see note from patient below. Thanks.

## 2016-05-22 NOTE — Progress Notes (Signed)
45 y.o. G0P0000 SingleCaucasianF here for annual exam.  Doing well.  Cycles are regular.  She did have some break through bleeding earlier this year but this resolved.  Not having any bloating.  Significant other moved back to [place].  Was with him for 1 1/2 years.    Patient's last menstrual period was 05/18/2016.          Sexually active: Yes.    The current method of family planning is OCP (estrogen/progesterone).    Exercising: Yes.    Strength training Smoker:  no  Health Maintenance: Pap:  02/23/15 negative, HR HPV negative  History of abnormal Pap:  yes MMG:  03/28/14 BIRADS 1 negative.  Pt is Colonoscopy:  never BMD:   never TDaP:  08/18/06  Pneumonia vaccine(s):  never Zostavax:   never Hep C testing: not indicated  Screening Labs: would like to come back for fasting labs, Hb today: same, Urine today: normal    reports that she has never smoked. She has never used smokeless tobacco. She reports that she drinks about 0.6 - 1.2 oz of alcohol per week . She reports that she does not use drugs.  Past Medical History:  Diagnosis Date  . History of abnormal Pap smear 2/93   LEEP, CIN I    Past Surgical History:  Procedure Laterality Date  . CATARACT EXTRACTION Left 2012  . LEEP  1993   CIN I  . TONSILLECTOMY  11/14    Current Outpatient Prescriptions  Medication Sig Dispense Refill  . Cholecalciferol (VITAMIN D PO) Take 1,000 Int'l Units by mouth.     . Cyanocobalamin (B-12 PO) Take by mouth.    . fluconazole (DIFLUCAN) 150 MG tablet Take 1 tablet (150 mg total) by mouth once. Take one tablet.  Repeat in 48 hours if symptoms are not completely resolved. 2 tablet 3  . MICROGESTIN 1-20 MG-MCG tablet TAKE 1 TABLET BY MOUTH EVERY DAY 63 tablet 0  . Multiple Vitamin (MULTIVITAMIN) tablet Take 1 tablet by mouth daily.     No current facility-administered medications for this visit.     Family History  Problem Relation Age of Onset  . Breast cancer Maternal Aunt   . Breast  cancer Cousin     maternal  . Colon cancer Mother 62  . Colon cancer Maternal Grandmother   . Colon cancer Maternal Grandfather   . Colon cancer Maternal Uncle   . Lung cancer Paternal Grandfather     ROS:  Pertinent items are noted in HPI.  Otherwise, a comprehensive ROS was negative.  Exam:   BP 92/60 (BP Location: Right Arm, Patient Position: Sitting, Cuff Size: Normal)   Pulse 72   Resp 14   Ht 5' (1.524 m)   Wt 123 lb 6.4 oz (56 kg)   LMP 05/18/2016   BMI 24.10 kg/m   Weight change: @WEIGHTCHANGE @ Height:   Height: 5' (152.4 cm)  Ht Readings from Last 3 Encounters:  05/23/16 5' (1.524 m)  02/23/15 4' 11.5" (1.511 m)  03/22/14 5' 0.25" (1.53 m)    General appearance: alert, cooperative and appears stated age Head: Normocephalic, without obvious abnormality, atraumatic Neck: no adenopathy, supple, symmetrical, trachea midline and thyroid normal to inspection and palpation Lungs: clear to auscultation bilaterally Breasts: normal appearance, no masses or tenderness Heart: regular rate and rhythm Abdomen: soft, non-tender; bowel sounds normal; no masses,  no organomegaly Extremities: extremities normal, atraumatic, no cyanosis or edema Skin: Skin color, texture, turgor normal. No rashes or  lesions Lymph nodes: Cervical, supraclavicular, and axillary nodes normal. No abnormal inguinal nodes palpated Neurologic: Grossly normal   Pelvic: External genitalia:  no lesions              Urethra:  normal appearing urethra with no masses, tenderness or lesions              Bartholins and Skenes: normal                 Vagina: normal appearing vagina with normal color and discharge, no lesions              Cervix: no lesions              Pap taken: Yes.   Bimanual Exam:  Uterus:  normal size, contour, position, consistency, mobility, non-tender              Adnexa: normal adnexa and no mass, fullness, tenderness               Rectovaginal: Confirms               Anus:  normal  sphincter tone, no lesions  Chaperone was present for exam.  A:  Well woman with normal exam  H/o menorrhagia improved with OCP.  Considering ablation. Strong family hx of colon cancer (mother age 14 and several other family members)  Anxiety  H/O ASCUS pap with +HR HPV 2013. Neg pap with neg HR HPV 2014.  ASCUS pap 2015.   H/O recurrent yeast during summer months  P: Mammogram yearly. pap smear with HR HPV testing today  Microgestin rx to pharmacy Diflucan 150mg  po x 1, repeat 48 hrs if needed.  #3RF. Aware tdap due next year Return for fasting lab work.  Orders placed. Return annually or prn

## 2016-05-23 ENCOUNTER — Encounter: Payer: Self-pay | Admitting: Obstetrics & Gynecology

## 2016-05-23 ENCOUNTER — Ambulatory Visit (INDEPENDENT_AMBULATORY_CARE_PROVIDER_SITE_OTHER): Payer: 59 | Admitting: Obstetrics & Gynecology

## 2016-05-23 VITALS — BP 92/60 | HR 72 | Resp 14 | Ht 60.0 in | Wt 123.4 lb

## 2016-05-23 DIAGNOSIS — Z0000 Encounter for general adult medical examination without abnormal findings: Secondary | ICD-10-CM | POA: Diagnosis not present

## 2016-05-23 DIAGNOSIS — Z124 Encounter for screening for malignant neoplasm of cervix: Secondary | ICD-10-CM | POA: Diagnosis not present

## 2016-05-23 DIAGNOSIS — Z01419 Encounter for gynecological examination (general) (routine) without abnormal findings: Secondary | ICD-10-CM

## 2016-05-23 LAB — POCT URINALYSIS DIPSTICK
Bilirubin, UA: NEGATIVE
Blood, UA: NEGATIVE
Glucose, UA: NEGATIVE
Ketones, UA: NEGATIVE
Leukocytes, UA: NEGATIVE
Nitrite, UA: NEGATIVE
Protein, UA: NEGATIVE
Urobilinogen, UA: NEGATIVE
pH, UA: 5

## 2016-05-23 MED ORDER — FLUCONAZOLE 150 MG PO TABS: 150.0000 mg | ORAL_TABLET | Freq: Once | ORAL | 3 refills | Status: AC

## 2016-05-23 MED ORDER — NORETHINDRONE ACET-ETHINYL EST 1-20 MG-MCG PO TABS: 1.0000 | ORAL_TABLET | Freq: Every day | ORAL | 4 refills | Status: DC

## 2016-05-23 NOTE — Patient Instructions (Signed)
Endometrial Ablation °Endometrial ablation removes the lining of the uterus (endometrium). It is usually a same-day, outpatient treatment. Ablation helps avoid major surgery, such as surgery to remove the cervix and uterus (hysterectomy). After endometrial ablation, you will have little or no menstrual bleeding and may not be able to have children. However, if you are premenopausal, you will need to use a reliable method of birth control following the procedure because of the small chance that pregnancy can occur. °There are different reasons to have this procedure. These reasons include: °· Heavy periods. °· Bleeding that is causing anemia. °· Irregular bleeding. °· Bleeding fibroids on the lining inside the uterus if they are smaller than 3 centimeters. °This procedure may not be possible for you if:  °· You want to have children in the future.   °· You have severe cramps with your menstrual period.   °· You have precancerous or cancerous cells in your uterus.   °· You were recently pregnant.   °· You have gone through menopause.   °· You have had major surgery on your uterus, resulting in thinning of the uterine wall. Surgeries may include: °¨ The removal of one or more uterine fibroids (myomectomy). °¨ A cesarean section with a classic (vertical) incision on your uterus. Ask your health care provider what type of cesarean you had. Sometimes the scar on your skin is different than the scar on your uterus. °Even if you have had surgery on your uterus, certain types of ablation may still be safe for you. Talk with your health care provider. °LET YOUR HEALTH CARE PROVIDER KNOW ABOUT: °· Any allergies you have. °· All medicines you are taking, including vitamins, herbs, eye drops, creams, and over-the-counter medicines. °· Previous problems you or members of your family have had with the use of anesthetics. °· Any blood disorders you have. °· Previous surgeries you have had. °· Medical conditions you have. °RISKS AND  COMPLICATIONS  °Generally, this is a safe procedure. However, as with any procedure, complications can occur. Possible complications include: °· Perforation of the uterus. °· Bleeding. °· Infection of the uterus, bladder, or vagina. °· Injury to surrounding organs. °· An air bubble to the lung (air embolus). °· Pregnancy following the procedure. °· Failure of the procedure to help the problem, requiring hysterectomy. °· Decreased ability to diagnose cancer in the lining of the uterus. °BEFORE THE PROCEDURE °· The lining of the uterus must be tested to make sure there is no pre-cancerous or cancer cells present. °· An ultrasound may be performed to look at the size of the uterus and to check for abnormalities. °· Medicines may be given to thin the lining of the uterus. °PROCEDURE  °During the procedure, your health care provider will use a tool called a resectoscope to help see inside your uterus. There are different ways to remove the lining of your uterus.  °· Radiofrequency - This method uses a radiofrequency-alternating electric current to remove the lining of the uterus. °· Cryotherapy - This method uses extreme cold to freeze the lining of the uterus. °· Heated-Free Liquid - This method uses heated salt (saline) solution to remove the lining of the uterus. °· Microwave - This method uses high-energy microwaves to heat up the lining of the uterus to remove it. °· Thermal balloon - This method involves inserting a catheter with a balloon tip into the uterus. The balloon tip is filled with heated fluid to remove the lining of the uterus. °AFTER THE PROCEDURE  °After your procedure, do   not have sexual intercourse or insert anything into your vagina until permitted by your health care provider. After the procedure, you may experience: °· Cramps. °· Vaginal discharge. °· Frequent urination. °  °This information is not intended to replace advice given to you by your health care provider. Make sure you discuss any  questions you have with your health care provider. °  °Document Released: 06/13/2004 Document Revised: 04/25/2015 Document Reviewed: 01/05/2013 °Elsevier Interactive Patient Education ©2016 [employer] ° °

## 2016-05-27 LAB — IPS PAP TEST WITH HPV

## 2016-06-02 ENCOUNTER — Telehealth: Payer: Self-pay | Admitting: Obstetrics & Gynecology

## 2016-06-02 ENCOUNTER — Other Ambulatory Visit (INDEPENDENT_AMBULATORY_CARE_PROVIDER_SITE_OTHER): Payer: 59

## 2016-06-02 DIAGNOSIS — Z0000 Encounter for general adult medical examination without abnormal findings: Secondary | ICD-10-CM

## 2016-06-02 LAB — CBC
HEMATOCRIT: 40.7 % (ref 35.0–45.0)
Hemoglobin: 13.5 g/dL (ref 11.7–15.5)
MCH: 31.4 pg (ref 27.0–33.0)
MCHC: 33.2 g/dL (ref 32.0–36.0)
MCV: 94.7 fL (ref 80.0–100.0)
MPV: 10.5 fL (ref 7.5–12.5)
Platelets: 245 10*3/uL (ref 140–400)
RBC: 4.3 MIL/uL (ref 3.80–5.10)
RDW: 13 % (ref 11.0–15.0)
WBC: 12.5 10*3/uL — ABNORMAL HIGH (ref 3.8–10.8)

## 2016-06-02 LAB — COMPREHENSIVE METABOLIC PANEL
ALBUMIN: 4.1 g/dL (ref 3.6–5.1)
ALK PHOS: 41 U/L (ref 33–115)
ALT: 11 U/L (ref 6–29)
AST: 17 U/L (ref 10–30)
BUN: 15 mg/dL (ref 7–25)
CALCIUM: 9 mg/dL (ref 8.6–10.2)
CHLORIDE: 102 mmol/L (ref 98–110)
CO2: 26 mmol/L (ref 20–31)
Creat: 0.86 mg/dL (ref 0.50–1.10)
Glucose, Bld: 83 mg/dL (ref 65–99)
POTASSIUM: 4 mmol/L (ref 3.5–5.3)
Sodium: 139 mmol/L (ref 135–146)
TOTAL PROTEIN: 6.4 g/dL (ref 6.1–8.1)
Total Bilirubin: 0.3 mg/dL (ref 0.2–1.2)

## 2016-06-02 LAB — LIPID PANEL
CHOL/HDL RATIO: 2.5 ratio (ref <=5.0)
CHOLESTEROL: 185 mg/dL (ref 125–200)
HDL: 73 mg/dL (ref >=46)
LDL Cholesterol: 97 mg/dL (ref <130)
TRIGLYCERIDES: 74 mg/dL (ref <150)
VLDL: 15 mg/dL (ref <30)

## 2016-06-02 LAB — TSH: TSH: 3.41 mIU/L

## 2016-06-02 NOTE — Telephone Encounter (Signed)
I called this patient on 05/28/16 to convey benefits for recommended surgical procedure, however the phone voicemail box was full.  Called the only number on patients file, and once again the voicemail box is full.

## 2016-06-02 NOTE — Telephone Encounter (Signed)
Patient noticed the missed call from our office and returned call. Spoke with patient in regards to benefits for surgical procedures. Patient understood and is agreeable. Patient advises she will call back when ready to proceed with scheduling.  Routing to Dr Sabra Heck  cc: Lamont Snowball

## 2016-06-02 NOTE — Telephone Encounter (Signed)
Call to patient. States she plans to hold off on scheduling for now. May reconsider when has met more of insurance deductible.  Will plan to continue oral contraceptive pills for management of menorrhagia and contraception for now. To call back if this is no longer helping symptoms or if desires to proceed.  Routing to provider for final review. Patient agreeable to disposition. Will close encounter.

## 2016-06-03 LAB — VITAMIN D 25 HYDROXY (VIT D DEFICIENCY, FRACTURES): Vit D, 25-Hydroxy: 42 ng/mL (ref 30–100)

## 2016-06-11 ENCOUNTER — Telehealth: Payer: Self-pay | Admitting: *Deleted

## 2016-06-11 DIAGNOSIS — D72829 Elevated white blood cell count, unspecified: Secondary | ICD-10-CM

## 2016-06-11 NOTE — Telephone Encounter (Signed)
-----   Message from Megan Salon, MD sent at 06/10/2016  6:30 PM EDT ----- Please notify pt her Vit D, CMP, Lipids, and TSH were normal.  CBC showed a mildly elevated WBC.  I would repeat in 1 month.  Please order CBC with diff for pt.  Thanks.

## 2016-06-11 NOTE — Telephone Encounter (Signed)
Left voicemail to call back.  Needs lab appt in 1 month and CBC with Diff order.

## 2016-06-11 NOTE — Telephone Encounter (Signed)
Patient notified. Lab ordered and appt scheduled. Patient verbalized understanding.

## 2016-07-14 ENCOUNTER — Other Ambulatory Visit (INDEPENDENT_AMBULATORY_CARE_PROVIDER_SITE_OTHER): Payer: 59

## 2016-07-14 ENCOUNTER — Other Ambulatory Visit: Payer: 59

## 2016-07-14 DIAGNOSIS — D72829 Elevated white blood cell count, unspecified: Secondary | ICD-10-CM

## 2016-07-14 LAB — CBC WITH DIFFERENTIAL/PLATELET
BASOS ABS: 69 {cells}/uL (ref 0–200)
Basophils Relative: 1 %
EOS PCT: 2 %
Eosinophils Absolute: 138 cells/uL (ref 15–500)
HCT: 40.4 % (ref 35.0–45.0)
Hemoglobin: 13.4 g/dL (ref 11.7–15.5)
Lymphocytes Relative: 30 %
Lymphs Abs: 2070 cells/uL (ref 850–3900)
MCH: 31.2 pg (ref 27.0–33.0)
MCHC: 33.2 g/dL (ref 32.0–36.0)
MCV: 94.2 fL (ref 80.0–100.0)
MONOS PCT: 7 %
MPV: 10.8 fL (ref 7.5–12.5)
Monocytes Absolute: 483 cells/uL (ref 200–950)
NEUTROS PCT: 60 %
Neutro Abs: 4140 cells/uL (ref 1500–7800)
Platelets: 231 10*3/uL (ref 140–400)
RBC: 4.29 MIL/uL (ref 3.80–5.10)
RDW: 13.3 % (ref 11.0–15.0)
WBC: 6.9 10*3/uL (ref 3.8–10.8)

## 2017-04-27 ENCOUNTER — Telehealth: Payer: Self-pay | Admitting: Obstetrics & Gynecology

## 2017-04-27 MED ORDER — NORETHINDRONE ACET-ETHINYL EST 1-20 MG-MCG PO TABS: 1.0000 | ORAL_TABLET | Freq: Every day | ORAL | 4 refills | Status: DC

## 2017-04-27 NOTE — Telephone Encounter (Signed)
Attempted to reach patient at number provided [phone], there was no answer and recording states that the voicemail box is full.

## 2017-04-27 NOTE — Telephone Encounter (Signed)
Spoke with patient. Patient states that she has been taking Microgestin 1-20 mg-mcg and has done well. Patient was given a generic and has been having hot flashes and BTB. Wants to only take Microgestin. States she is unsure whey her pharmacy cannot obtain Microgestin. Advised will contact the pharmacy and return call.  Spoke with Abigail Butts at [facility] who states that [facility] is not carrying Microgestin 1-20 any longer. Only carrying Junel 1-20. Patient will have to get rx filled at another pharmacy to have Microgestin.  Left message to call [place] at [phone].

## 2017-04-27 NOTE — Telephone Encounter (Signed)
Spoke with patient. Advised rx has been sent to [facility] Patient is agreeable.  Routing to provider for final review. Patient agreeable to disposition. Will close encounter.

## 2017-04-27 NOTE — Telephone Encounter (Signed)
Patient would like to speak with nurse about her birth control.

## 2017-04-27 NOTE — Telephone Encounter (Signed)
Spoke with patient. Patient is agreeable to have rx sent to another pharmacy[facility]  [facility] [address] in [facility] [address] [address] in [facility]  Rx for Microgestin 1-20 #1 4RF to get patient to next aex with Dr.Miller [facility] [address].  Left message to call [place] at [phone].

## 2017-06-10 ENCOUNTER — Other Ambulatory Visit: Payer: Self-pay | Admitting: Obstetrics & Gynecology

## 2017-06-10 DIAGNOSIS — Z1231 Encounter for screening mammogram for malignant neoplasm of breast: Secondary | ICD-10-CM

## 2017-07-02 ENCOUNTER — Ambulatory Visit
Admission: RE | Admit: 2017-07-02 | Discharge: 2017-07-02 | Disposition: A | Discharge status: Home / Self Care | Payer: 59 | Source: Ambulatory Visit | Attending: Obstetrics & Gynecology | Admitting: Obstetrics & Gynecology

## 2017-07-02 DIAGNOSIS — Z1231 Encounter for screening mammogram for malignant neoplasm of breast: Secondary | ICD-10-CM | POA: Diagnosis not present

## 2017-07-19 DIAGNOSIS — J019 Acute sinusitis, unspecified: Secondary | ICD-10-CM | POA: Diagnosis not present

## 2017-07-23 DIAGNOSIS — J329 Chronic sinusitis, unspecified: Secondary | ICD-10-CM | POA: Diagnosis not present

## 2017-07-23 DIAGNOSIS — R05 Cough: Secondary | ICD-10-CM | POA: Diagnosis not present

## 2017-08-21 ENCOUNTER — Other Ambulatory Visit: Payer: Self-pay | Admitting: Obstetrics & Gynecology

## 2017-08-21 NOTE — Telephone Encounter (Signed)
Patient called and is requesting to schedule her ablation.

## 2017-08-24 NOTE — Telephone Encounter (Signed)
Call from patient. States annual exam is scheduled for 09-11-17.  Advised we can proceed with preliminary step to procedure in preparation for appointment on 09-11-17.  Date options for February discussed and patient thinks she would like 10-05-17.  Patient requests refill of OCP to get to appointment.     Would case be posted as Hysteroscopy/uterine ablation/Novasure/ poss BS/poss BTSP. Please advise.

## 2017-08-24 NOTE — Telephone Encounter (Signed)
Return call to patient. Left message to call back.  Last seen in office 05-23-2016 for annual exam and ablation was discussed.

## 2017-08-26 ENCOUNTER — Other Ambulatory Visit: Payer: Self-pay | Admitting: Obstetrics & Gynecology

## 2017-08-26 NOTE — Telephone Encounter (Signed)
Medication refill request: OCP  Last AEX:  05-23-16  Next AEX: 09-11-17  Last MMG (if hormonal medication request): 07-02-17 WNL  Refill authorized: please advise

## 2017-08-27 MED ORDER — NORETHINDRONE ACET-ETHINYL EST 1-20 MG-MCG PO TABS: 1.0000 | ORAL_TABLET | Freq: Every day | ORAL | 1 refills | Status: DC

## 2017-08-28 NOTE — Telephone Encounter (Signed)
Refill has been completed.  Just FYI.

## 2017-09-11 ENCOUNTER — Encounter: Payer: Self-pay | Admitting: Obstetrics & Gynecology

## 2017-09-11 ENCOUNTER — Other Ambulatory Visit (HOSPITAL_COMMUNITY)
Admission: RE | Admit: 2017-09-11 | Discharge: 2017-09-11 | Disposition: A | Discharge status: Home / Self Care | Payer: 59 | Source: Ambulatory Visit | Attending: Obstetrics & Gynecology | Admitting: Obstetrics & Gynecology

## 2017-09-11 ENCOUNTER — Ambulatory Visit: Payer: 59 | Admitting: Obstetrics & Gynecology

## 2017-09-11 VITALS — BP 130/74 | HR 76 | Resp 14 | Ht 60.0 in | Wt 128.0 lb

## 2017-09-11 DIAGNOSIS — Z124 Encounter for screening for malignant neoplasm of cervix: Secondary | ICD-10-CM | POA: Diagnosis not present

## 2017-09-11 DIAGNOSIS — N841 Polyp of cervix uteri: Secondary | ICD-10-CM | POA: Diagnosis not present

## 2017-09-11 DIAGNOSIS — Z202 Contact with and (suspected) exposure to infections with a predominantly sexual mode of transmission: Secondary | ICD-10-CM

## 2017-09-11 DIAGNOSIS — N926 Irregular menstruation, unspecified: Secondary | ICD-10-CM

## 2017-09-11 DIAGNOSIS — R350 Frequency of micturition: Secondary | ICD-10-CM

## 2017-09-11 DIAGNOSIS — Z01419 Encounter for gynecological examination (general) (routine) without abnormal findings: Secondary | ICD-10-CM

## 2017-09-11 DIAGNOSIS — R05 Cough: Secondary | ICD-10-CM

## 2017-09-11 DIAGNOSIS — R5383 Other fatigue: Secondary | ICD-10-CM

## 2017-09-11 DIAGNOSIS — R059 Cough, unspecified: Secondary | ICD-10-CM

## 2017-09-11 LAB — POCT URINALYSIS DIPSTICK
Bilirubin, UA: NEGATIVE
Blood, UA: NEGATIVE
GLUCOSE UA: NEGATIVE
Ketones, UA: NEGATIVE
Leukocytes, UA: NEGATIVE
NITRITE UA: NEGATIVE
Protein, UA: NEGATIVE
Urobilinogen, UA: 0.2 E.U./dL
pH, UA: 6 (ref 5.0–8.0)

## 2017-09-11 LAB — POCT URINE PREGNANCY: PREG TEST UR: NEGATIVE

## 2017-09-11 MED ORDER — NORETHINDRONE ACET-ETHINYL EST 1-20 MG-MCG PO TABS: ORAL_TABLET | ORAL | 4 refills | Status: DC

## 2017-09-11 NOTE — Progress Notes (Signed)
47 y.o. G0P0000 SingleCaucasianF here for annual exam.  Mother was diagnosed with glioblastoma yesterday.  Cannot proceed with any surgery at this time--has been .  Has started taking her pills continuous active and this has helped.  Was having increased spotting.     Aware does need ultrasound and endometrial biopsy prior to surgery.  Desires to have endometrial ablation and not have tubal ligation.  Would plan to stop on OCPs at this time.  Desires short recovery due to mother's recent diagnosis.   New sexual partner this year.  Patient's last menstrual period was 08/01/2017 (approximate).          Sexually active: Yes.    The current method of family planning is OCP (estrogen/progesterone).    Exercising: Yes.    cardio Smoker:  no  Health Maintenance: Pap:  05/23/16 Neg. HR HPV:neg   02/23/15 Neg. HR HPV:neg  History of abnormal Pap:  Yes, ASCUS. HR HPV 2013 MMG:  07/02/17 BIRADS1:neg  Colonoscopy:  Never BMD:   Never TDaP: 2008 Screening Labs: today [id number]   reports that  has never smoked. she has never used smokeless tobacco. She reports that she drinks about 0.6 - 1.2 oz of alcohol per week. She reports that she does not use drugs.  Past Medical History:  Diagnosis Date  . History of abnormal Pap smear 2/93   LEEP, CIN I    Past Surgical History:  Procedure Laterality Date  . CATARACT EXTRACTION Left 2012  . LEEP  1993   CIN I  . TONSILLECTOMY  11/14    Current Outpatient Medications  Medication Sig Dispense Refill  . Biotin 5 MG CAPS Take by mouth daily.    . Cholecalciferol (VITAMIN D PO) Take 1,000 Int'l Units by mouth.     . Multiple Vitamin (MULTIVITAMIN) tablet Take 1 tablet by mouth daily.    . norethindrone-ethinyl estradiol (MICROGESTIN) 1-20 MG-MCG tablet Take 1 tablet by mouth daily. 1 Package 1   No current facility-administered medications for this visit.     Family History  Problem Relation Age of Onset  . Breast cancer Maternal Aunt   .  Breast cancer Cousin        maternal  . Colon cancer Mother 37  . Colon cancer Maternal Grandmother   . Colon cancer Maternal Grandfather   . Colon cancer Maternal Uncle   . Lung cancer Paternal Grandfather     ROS:  Pertinent items are noted in HPI.  Otherwise, a comprehensive ROS was negative.  Exam:   BP 130/74 (BP Location: Right Arm, Patient Position: Sitting, Cuff Size: Normal)   Pulse 76   Resp 14   Ht 5' (1.524 m)   Wt 128 lb (58.1 kg)   LMP 08/01/2017 (Approximate)   BMI 25.00 kg/m   Weight change:  +5# Height: 5' (152.4 cm)  Ht Readings from Last 3 Encounters:  09/11/17 5' (1.524 m)  05/23/16 5' (1.524 m)  02/23/15 4' 11.5" (1.511 m)    General appearance: alert, cooperative and appears stated age Head: Normocephalic, without obvious abnormality, atraumatic Neck: no adenopathy, supple, symmetrical, trachea midline and thyroid normal to inspection and palpation Lungs: clear to auscultation bilaterally Breasts: normal appearance, no masses or tenderness Heart: regular rate and rhythm Abdomen: soft, non-tender; bowel sounds normal; no masses,  no organomegaly Extremities: extremities normal, atraumatic, no cyanosis or edema Skin: Skin color, texture, turgor normal. No rashes or lesions Lymph nodes: Cervical, supraclavicular, and axillary nodes normal. No abnormal inguinal nodes  palpated Neurologic: Grossly normal  Pelvic: External genitalia:  no lesions              Urethra:  normal appearing urethra with no masses, tenderness or lesions              Bartholins and Skenes: normal                 Vagina: normal appearing vagina with normal color and discharge, no lesions              Cervix: no lesions, polyp noted              Pap taken: Yes.   Bimanual Exam:  Uterus:  normal size, contour, position, consistency, mobility, non-tender              Adnexa: normal adnexa and no mass, fullness, tenderness               Rectovaginal: Confirms               Anus:   normal sphincter tone, no lesions  Polyp removal noted.  Verbal consent obtained.  Polyp forceps used to remove polyp at base.  Sterile technique used.  Polyp sent to pathology.  Silver nitrate used for excellent hemostasis.  Pt tolerated procedure well.  Chaperone was present for exam.  A:  Well Woman with normal exam Mid cycle spotting Possible OAB Chronic cough Cervical polyp, removed today  P:   Mammogram guidelines reviewed pap smear only today.  Pt desires yearly. GC/Chl pending CBC, CMP, TSH CXR, PA and lateral, ordered. Cervical polyp pathology pending Pt will start taking pills continuous active.  Rx done for 90 day supply. return annually or prn

## 2017-09-12 LAB — COMPREHENSIVE METABOLIC PANEL
ALT: 9 IU/L (ref 0–32)
AST: 20 IU/L (ref 0–40)
Albumin/Globulin Ratio: 1.7 (ref 1.2–2.2)
Albumin: 4.5 g/dL (ref 3.5–5.5)
Alkaline Phosphatase: 50 IU/L (ref 39–117)
BUN / CREAT RATIO: 16 (ref 9–23)
BUN: 13 mg/dL (ref 6–24)
Bilirubin Total: 0.3 mg/dL (ref 0.0–1.2)
CALCIUM: 10 mg/dL (ref 8.7–10.2)
CO2: 25 mmol/L (ref 20–29)
CREATININE: 0.8 mg/dL (ref 0.57–1.00)
Chloride: 97 mmol/L (ref 96–106)
GFR calc Af Amer: 104 mL/min/{1.73_m2} (ref >59)
GFR, EST NON AFRICAN AMERICAN: 90 mL/min/{1.73_m2} (ref >59)
Globulin, Total: 2.6 g/dL (ref 1.5–4.5)
Glucose: 97 mg/dL (ref 65–99)
Potassium: 4.6 mmol/L (ref 3.5–5.2)
SODIUM: 139 mmol/L (ref 134–144)
Total Protein: 7.1 g/dL (ref 6.0–8.5)

## 2017-09-12 LAB — CBC WITH DIFFERENTIAL/PLATELET
Basophils Absolute: 0.1 10*3/uL (ref 0.0–0.2)
Basos: 1 %
EOS (ABSOLUTE): 0.3 10*3/uL (ref 0.0–0.4)
EOS: 3 %
HEMOGLOBIN: 14.3 g/dL (ref 11.1–15.9)
Hematocrit: 42.9 % (ref 34.0–46.6)
Immature Grans (Abs): 0 10*3/uL (ref 0.0–0.1)
Immature Granulocytes: 0 %
Lymphocytes Absolute: 2.5 10*3/uL (ref 0.7–3.1)
Lymphs: 24 %
MCH: 31.3 pg (ref 26.6–33.0)
MCHC: 33.3 g/dL (ref 31.5–35.7)
MCV: 94 fL (ref 79–97)
Monocytes Absolute: 0.6 10*3/uL (ref 0.1–0.9)
Monocytes: 5 %
NEUTROS ABS: 7.1 10*3/uL — AB (ref 1.4–7.0)
Neutrophils: 67 %
Platelets: 279 10*3/uL (ref 150–379)
RBC: 4.57 x10E6/uL (ref 3.77–5.28)
RDW: 13.4 % (ref 12.3–15.4)
WBC: 10.5 10*3/uL (ref 3.4–10.8)

## 2017-09-12 LAB — TSH: TSH: 1.36 u[IU]/mL (ref 0.450–4.500)

## 2017-09-14 LAB — CYTOLOGY - PAP
Chlamydia: NEGATIVE
Diagnosis: NEGATIVE
Neisseria Gonorrhea: NEGATIVE

## 2017-09-18 ENCOUNTER — Ambulatory Visit
Admission: RE | Admit: 2017-09-18 | Discharge: 2017-09-18 | Disposition: A | Discharge status: Home / Self Care | Payer: 59 | Source: Ambulatory Visit | Attending: Obstetrics & Gynecology | Admitting: Obstetrics & Gynecology

## 2017-09-18 DIAGNOSIS — R05 Cough: Secondary | ICD-10-CM | POA: Diagnosis not present

## 2017-09-18 DIAGNOSIS — R059 Cough, unspecified: Secondary | ICD-10-CM

## 2017-09-22 ENCOUNTER — Telehealth: Payer: Self-pay | Admitting: *Deleted

## 2017-09-22 NOTE — Telephone Encounter (Signed)
-----   Message from Megan Salon, MD sent at 09/22/2017 10:53 AM EST ----- Please let pt know her pap was normal and HR HPV was negative.  02 recall.  Her cervical polyp was benign.  Also, GC and Chlamydia testing was negative.  Another result note for a CXR was also sent to you.

## 2017-09-22 NOTE — Telephone Encounter (Signed)
Spoke with patient, advised of both result notes as seen below per Dr. Sabra Heck. Patient verbalizes understanding and is agreeable.   Will close encounter.

## 2017-09-22 NOTE — Telephone Encounter (Signed)
-----   Message from Megan Salon, MD sent at 09/22/2017 10:51 AM EST ----- Please let pt know her CXR was normal.  She's has a persistent cough after URI this winter.  Should now start flonase (which is now OTC) and should used two sprays per nostril twice daily.  Needs to use for two to three weeks unless cough resolves sooner.  If still not resolved, she will then need to see a pulmonologist.  Please ask her to give update in two to three weeks.  There is another result note I am forwarding to you as well.

## 2017-09-22 NOTE — Telephone Encounter (Signed)
Attempted to reach patient- mailbox full. Unable to leave a message for patient to return call.

## 2017-09-22 NOTE — Telephone Encounter (Signed)
Message left to return call to Consetta Cosner at [phone].    

## 2017-09-22 NOTE — Telephone Encounter (Signed)
Patient returning call.

## 2017-10-23 DIAGNOSIS — L738 Other specified follicular disorders: Secondary | ICD-10-CM | POA: Diagnosis not present

## 2017-10-23 DIAGNOSIS — L918 Other hypertrophic disorders of the skin: Secondary | ICD-10-CM | POA: Diagnosis not present

## 2017-11-05 ENCOUNTER — Ambulatory Visit: Payer: Self-pay | Admitting: *Deleted

## 2017-11-05 DIAGNOSIS — I781 Nevus, non-neoplastic: Secondary | ICD-10-CM

## 2017-11-05 DIAGNOSIS — I8393 Asymptomatic varicose veins of bilateral lower extremities: Secondary | ICD-10-CM

## 2017-11-05 NOTE — Progress Notes (Signed)
.    Cutaneous Laser:pulsed mode  810j/cm2 400 ms delay  13 ms Duration 0.5 spot  Total pulses: 1149 Total energy 1.815  Total time::10  Photos: Yes.    Compression stockings applied: No.   Pt only had a few areas so opted for CL. Tol well. Good local rxn. Treated tiny red dots too. Anticipate good results. Follow prn.

## 2017-11-30 ENCOUNTER — Telehealth: Payer: Self-pay | Admitting: Obstetrics & Gynecology

## 2017-11-30 NOTE — Telephone Encounter (Signed)
Patient says she spoke with Dr Sabra Heck about getting a prescription to help her after her mom passed away and she would ike to speak with nurse about that.

## 2017-11-30 NOTE — Telephone Encounter (Signed)
Spoke with patient who states that her mother passed away 2 weeks ago. Reports she is having a really hard time since her passing. Is very sad and anxious. States that she discussed possible medication options with Dr.Miller to help get her through this time. Asking if Dr.Miller will start her on a medication to help. Does not want a medication that has increased hunger as a side effect. Advised will review with Dr.Miller and return call.

## 2017-12-01 ENCOUNTER — Other Ambulatory Visit: Payer: Self-pay | Admitting: Obstetrics & Gynecology

## 2017-12-01 MED ORDER — FLUOXETINE HCL 10 MG PO CAPS: 10.0000 mg | ORAL_CAPSULE | Freq: Every day | ORAL | 0 refills | Status: DC

## 2017-12-01 MED ORDER — TEMAZEPAM 15 MG PO CAPS: 15.0000 mg | ORAL_CAPSULE | Freq: Every evening | ORAL | 0 refills | Status: DC | PRN

## 2017-12-01 NOTE — Telephone Encounter (Signed)
Spoke with patient. Advised of message as seen below from [place]. Patient is agreeable. Rx for Prozac 10 mg daily #30 0RF sent to pharmacy on file. Follow up scheduled for 01/01/2018 at 11:15 am with Dr.Miller. Patient would like something to help with sleep just temporarily. Advised will speak with Dr.Miller and return call.

## 2017-12-01 NOTE — Telephone Encounter (Signed)
Yes.  I would start with 10mg  Prozac as this should not affect appetite and weight.  Does she need something for sleep?  I would recommend recheck in 3 weeks to see if dosage needs to be adjusted.

## 2017-12-01 NOTE — Telephone Encounter (Signed)
Medication refill request: Prozac  Last AEX:  09-11-17  Next AEX: 11-19-18  Last MMG (if hormonal medication request): 07-02-17 WNL  Refill authorized: please advise

## 2017-12-01 NOTE — Telephone Encounter (Addendum)
Attempted to reach patient at number provided [phone], there was no answer and voicemail box is full.

## 2017-12-01 NOTE — Telephone Encounter (Signed)
I think ok to use restoril 15mg  nightly.  #30/0RF.

## 2017-12-01 NOTE — Telephone Encounter (Signed)
Signed Rx to Dr. Sabra Heck. Call returned to patient, advised as seen below per Dr. Sabra Heck. Confirmed pharmacy on file. Patient verbalizes understanding and is agreeable.   Printed Rx for Restoril faxed to [facility] at [phone].  Will close encounter.

## 2017-12-16 ENCOUNTER — Ambulatory Visit: Payer: 59 | Admitting: *Deleted

## 2017-12-25 ENCOUNTER — Telehealth: Payer: Self-pay | Admitting: Obstetrics & Gynecology

## 2017-12-25 NOTE — Telephone Encounter (Signed)
Patient would like to discuss some breakthrough bleeding she has been having at her 01/01/18 appointment.

## 2017-12-25 NOTE — Telephone Encounter (Signed)
Patient on OCP, no longer taking continuously active, has been allowing for monthly menses since 09/2017. Reports BTB, currently in the middle of her pack, has experienced this in the past prior to polypectomy. No missed or late pills.   Changing pad 3-4x/day. Denies any other GYN symptoms, pain, dizziness, weakness, fatigue.  Patient will keep OV as scheduled on 5/17 with Dr. Sabra Heck to further discuss. Advised to return call to office for earlier appointment if bleeding becomes heavy or new symptoms develop.   Routing to provider for final review. Patient is agreeable to disposition. Will close encounter.

## 2018-01-01 ENCOUNTER — Other Ambulatory Visit: Payer: Self-pay | Admitting: Obstetrics & Gynecology

## 2018-01-01 ENCOUNTER — Other Ambulatory Visit: Payer: Self-pay

## 2018-01-01 ENCOUNTER — Ambulatory Visit (INDEPENDENT_AMBULATORY_CARE_PROVIDER_SITE_OTHER): Payer: 59 | Admitting: Obstetrics & Gynecology

## 2018-01-01 ENCOUNTER — Encounter: Payer: Self-pay | Admitting: Obstetrics & Gynecology

## 2018-01-01 VITALS — BP 128/80 | HR 68 | Resp 16 | Ht 60.0 in | Wt 128.6 lb

## 2018-01-01 DIAGNOSIS — F4321 Adjustment disorder with depressed mood: Secondary | ICD-10-CM

## 2018-01-01 DIAGNOSIS — N926 Irregular menstruation, unspecified: Secondary | ICD-10-CM

## 2018-01-01 MED ORDER — BUSPIRONE HCL 7.5 MG PO TABS: 7.5000 mg | ORAL_TABLET | Freq: Two times a day (BID) | ORAL | 0 refills | Status: DC

## 2018-01-01 MED ORDER — FLUOXETINE HCL 10 MG PO TABS: ORAL_TABLET | ORAL | 0 refills | Status: DC

## 2018-01-01 NOTE — Telephone Encounter (Signed)
Medication refill request: buspar 7.5, prozac 10mg  Last AEX:  09-11-17 Next AEX: 11-19-18 Last MMG (if hormonal medication request): 07-02-17 category c density birads 1:neg Refill authorized: pt has appt today with SM. Please approve if appropriate.

## 2018-01-01 NOTE — Patient Instructions (Signed)
Decrease the prozac to 5mg  daily for a week and then every other for another week and then stop.    Then you can start the buspar twice daily.

## 2018-01-01 NOTE — Progress Notes (Signed)
GYNECOLOGY  VISIT  CC:   Medication follow up  HPI: 47 y.o. G0P0000 Single Caucasian female here for BTB from 5/8 - 5/10 and for medication discussion.  Pt has been treated with Prozac 10mg  daily and Restoril 15mg  as needed for sleep.  Feels her anxiety is better and mood is more stable but feels she had no libido and is very frustrated with this and would like other options.  A little worried that Also concerned about weight.  Dealing with grief of mother's death.  Partner is supportive.  Continues to have heavy bleeding and has started back having mid-cycle bleeding.  H/O polyps noted.  Wants to discuss possible evaluation and treatment.  Interested in an ablation but not in a [place].  Is clearly aware she will still have pregnancy risk if does not choose to proceed with permanent sterilization.  GYNECOLOGIC HISTORY: Patient's last menstrual period was 12/23/2017. Contraception: OCP Menopausal hormone therapy: none  Patient Active Problem List   Diagnosis Date Noted  . DUB (dysfunctional uterine bleeding) 02/10/2014    Past Medical History:  Diagnosis Date  . History of abnormal Pap smear 2/93   LEEP, CIN I    Past Surgical History:  Procedure Laterality Date  . CATARACT EXTRACTION Left 2012  . LEEP  1993   CIN I  . TONSILLECTOMY  11/14    MEDS:   Current Outpatient Medications on File Prior to Visit  Medication Sig Dispense Refill  . Biotin 5 MG CAPS Take by mouth daily.    . Cholecalciferol (VITAMIN D PO) Take 1,000 Int'l Units by mouth.     Marland Kitchen FLUoxetine (PROZAC) 10 MG capsule TAKE 1 CAPSULE(10 MG) BY MOUTH DAILY 90 capsule 0  . hydrocortisone 2.5 % cream daily as needed.  0  . Multiple Vitamin (MULTIVITAMIN) tablet Take 1 tablet by mouth daily.    . norethindrone-ethinyl estradiol (MICROGESTIN) 1-20 MG-MCG tablet Take 1 tab daily, skipping placebo.  Takes continuous active. (Patient taking differently: Take 1 tablet by mouth daily. ) 4 Package 4  . temazepam (RESTORIL) 15  MG capsule Take 1 capsule (15 mg total) by mouth at bedtime as needed for sleep. 30 capsule 0   No current facility-administered medications on file prior to visit.     ALLERGIES: Patient has no known allergies.  Family History  Problem Relation Age of Onset  . Breast cancer Maternal Aunt   . Breast cancer Cousin        maternal  . Colon cancer Mother 25  . Colon cancer Maternal Grandmother   . Colon cancer Maternal Grandfather   . Colon cancer Maternal Uncle   . Lung cancer Paternal Grandfather     SH:  Single but in long term relationship  Review of Systems  Genitourinary:       Unscheduled bleeding Loss of sexual interest   Psychiatric/Behavioral: The patient is nervous/anxious.   All other systems reviewed and are negative.   PHYSICAL EXAMINATION:    BP 128/80 (BP Location: Right Arm, Patient Position: Sitting, Cuff Size: Normal)   Pulse 68   Resp 16   Ht 5' (1.524 m)   Wt 128 lb 9.6 oz (58.3 kg)   LMP 12/23/2017   BMI 25.12 kg/m     General appearance: alert, cooperative and appears stated age No exam performed today  Assessment: Grief reaction Decreased libido with Prozac Menorrhagia with mid cycle spotting  Plan: Return for PUS Will taper off Prozac and then start buspar 7.5mg  daily.  #  60/0RF.  Will review when she returns for PUS.   ~15 minutes spent with patient >50% of time was in face to face discussion of above.

## 2018-01-01 NOTE — Telephone Encounter (Signed)
I just saw pt today and the prozac was discontinued and the buspar is a new medication so I only did it for one month.  She is going to call and give update regarding results of medication.  Rx changes denied.

## 2018-01-01 NOTE — Progress Notes (Signed)
Patient schuled while in office for PUS on 01/07/18 at 12:30pm, consult to follow at 1pm with Dr. Sabra Heck. Patient aware will be called to review benefits prior to appointment. Patient verbalizes understanding and is agreeable. PUS order placed.

## 2018-01-07 ENCOUNTER — Ambulatory Visit (INDEPENDENT_AMBULATORY_CARE_PROVIDER_SITE_OTHER): Payer: 59

## 2018-01-07 ENCOUNTER — Ambulatory Visit (INDEPENDENT_AMBULATORY_CARE_PROVIDER_SITE_OTHER): Payer: 59 | Admitting: Obstetrics & Gynecology

## 2018-01-07 VITALS — BP 116/80 | HR 72 | Resp 16 | Ht 60.0 in | Wt 128.0 lb

## 2018-01-07 DIAGNOSIS — D250 Submucous leiomyoma of uterus: Secondary | ICD-10-CM

## 2018-01-07 DIAGNOSIS — D4959 Neoplasm of unspecified behavior of other genitourinary organ: Secondary | ICD-10-CM

## 2018-01-07 DIAGNOSIS — N926 Irregular menstruation, unspecified: Secondary | ICD-10-CM | POA: Diagnosis not present

## 2018-01-07 NOTE — Progress Notes (Unsigned)
47 y.o. G0P0000 Single {Race/ethnicity:17218} female here for pelvic ultrasound due to ***.  Patient's last menstrual period was 12/23/2017.  Contraception: ***  Findings:  UTERUS: *** EMS:*** ADNEXA: Left ovary: ***       Right ovary: *** CUL DE SAC: ***  Discussion:  ***.  Assessment:  ***  Plan:  Ca 125 and estradiol level today  ~{NUMBERS; -10-45 JOINT ROM:10287} minutes spent with patient >50% of time was in face to face discussion of above.

## 2018-01-07 NOTE — Progress Notes (Signed)
47 y.o. G0P0000 Single Caucasian female here for pelvic ultrasound due to h/o irregular bleeding that may have resolved since cervical polyp was removed.  She has not had any bleeding since that time.    Mother died due to glioblastoma in 2022/09/25.  Has been on Prozac and this has helped emotions but continues to have issues with anxiety.  Also, she is having libido issues with the prozac.  She is weaning off of this and will start Buspar next week.  Knows to call with any issues.    Pt does have hx of menorrhagia which is treated with OCPs.  Patient's last menstrual period was 12/23/2017.  Contraception: OCPs  Findings:  UTERUS: 6.5 x 4.4 x 3.0cm with 2.5 x 2.5cm, 1.1 x 0.7cm, and 0.5 x 0.6cm fibroids.  Largest fibroid does appear to have submucosal component in the left fundal region EMS: 2.81mm  ADNEXA: Left ovary: 3.0 x 2.8 x 1.3cm with 18 x 26 x 66mm avascular mass       Right ovary: 2.6 x 1.9 x 1.6cm CUL DE SAC:  No free fluid  Discussion:  Findings reviewed.  Do not think she should proceed with endometrial ablation at this point without having submucosal myomectomy with hysteroscopy prior. Also, feel should proceed with cas-125 today and follow ovarian findings vs consider excision.  Pt did have prior ultrasound 7/15 and this did not show any ovarian abnormality.  Pt has many questions which were addressed today.  She was really mentally prepared for endometrial ablation so she wants to consider our discussion before making any decisions  Assessment:  Uterine fibroids, submucosal fibroid Solid left ovarian mass  Plan:  Ca-125 will be obtained today.  Will also check estradiol.  At this point would recommend hysteroscopic resection of fibroid prior to doing any endometrial ablation.  May need to consider ovarian excision as well.  At least, ovarian finding needs to be followed.  Will call with results and discuss options further.  ~35 minutes spent with patient >50% of time was in face  to face discussion of above.

## 2018-01-08 LAB — CA 125: Cancer Antigen (CA) 125: 12.5 U/mL (ref 0.0–38.1)

## 2018-01-08 LAB — ESTRADIOL: Estradiol: 7.9 pg/mL

## 2018-01-10 ENCOUNTER — Encounter: Payer: Self-pay | Admitting: Obstetrics & Gynecology

## 2018-01-26 ENCOUNTER — Telehealth: Payer: Self-pay | Admitting: Obstetrics & Gynecology

## 2018-01-26 NOTE — Telephone Encounter (Signed)
Dr.Miller, please review and advise results from 01/07/18.

## 2018-01-26 NOTE — Telephone Encounter (Signed)
Patient is calling for her recent lab results. Patient said she was seen a few weeks ago.

## 2018-01-28 NOTE — Telephone Encounter (Signed)
Called pt and left detailed message about recent blood work.  She and I discussed at that visit possible ovary removal but she is not really interested in surgery at this time.  On message, recommended that we at least recheck ovary with PUS in 3-4 months.  Asked pt to call back to confirm that was ok and to schedule.  Thanks.

## 2018-02-04 NOTE — Telephone Encounter (Signed)
Left message to call Destry Bezdek at [phone]. 

## 2018-02-04 NOTE — Telephone Encounter (Signed)
Patient returned call to office to ask for an rx for Diflucan to take with her to [place]. Denies any current symptoms or problems. States has a history of getting yeast infections and her current rx for Diflucan has expired. Advised will review with Dr.Miller and return call.

## 2018-02-04 NOTE — Telephone Encounter (Signed)
Spoke with patient. Patient would like to proceed with scheduling PUS. PUS scheduled for 04/08/2018 at 1 pm with 1:30 pm consult with Dr.Miller. Patient is agreeable to date and time. Would also like to proceed with surgery in September. Advised will notify Dr.Miller and surgery scheduler regarding proceeding. Advised will return call if does not need repeat ultrasound due to wanting to proceed with surgery.   Cc: Lamont Snowball, RN  Routing to provider for final review. Patient agreeable to disposition.

## 2018-02-05 NOTE — Telephone Encounter (Signed)
Call to patient to discuss surgery plans. Voice mailbox is full, unable to leave message.

## 2018-02-08 ENCOUNTER — Other Ambulatory Visit: Payer: Self-pay | Admitting: Obstetrics & Gynecology

## 2018-02-08 MED ORDER — FLUCONAZOLE 150 MG PO TABS: 150.0000 mg | ORAL_TABLET | Freq: Once | ORAL | 0 refills | Status: AC

## 2018-02-08 NOTE — Telephone Encounter (Signed)
Call to patient to notify of Diflucan and follow-up on surgery. Voice mailbox is full, unable to leave message.

## 2018-02-08 NOTE — Progress Notes (Signed)
Diflucan order to pharmacy.

## 2018-02-08 NOTE — Telephone Encounter (Signed)
Diflucan was sent to pharmacy on file.

## 2018-02-09 NOTE — Telephone Encounter (Signed)
Call to patient regarding Diflucan and surgery plan.  Mail box remains full. Unable to leave message.

## 2018-02-19 ENCOUNTER — Other Ambulatory Visit: Payer: Self-pay | Admitting: Obstetrics and Gynecology

## 2018-02-19 ENCOUNTER — Other Ambulatory Visit: Payer: Self-pay | Admitting: Obstetrics & Gynecology

## 2018-02-19 NOTE — Telephone Encounter (Signed)
Medication refill request: Restoril 15 mg Cap Last AEX:  08/11/18 Next AEX: 11/19/18 Last MMG (if hormonal medication request): Bi Rads Category 1 neg  Refill authorized:  Please Advise   Medication refill request: Burspar 7.5 Last AEX:  08/11/18 Next AEX: 11/19/18 Last MMG (if hormonal medication request): Bi Rads Category 1 neg  Refill authorized:  Please Advise

## 2018-02-22 NOTE — Telephone Encounter (Signed)
Medication refill request: Burspar 7.5 MG  Last AEX:  09/11/17 Next AEX: 11/19/18 Last MMG (if hormonal medication request): 07/02/17 Bi-rads Category 1 neg  Refill authorized: Please refill if appropriate.

## 2018-02-23 NOTE — Telephone Encounter (Signed)
Please call pt and see if can get update about how she is feeling with this medication.  RF done 02/18/18 for #60/0RF by Dr. Quincy Simmonds.  RF for 90 day supply has been denied.

## 2018-02-24 NOTE — Telephone Encounter (Signed)
Called patient to see how she was doing on the Burspar, but her voicemail box was full and I could not leave a message.

## 2018-02-25 NOTE — Telephone Encounter (Signed)
No response from patient. Will await call from patient to proceed with surgery planning. Encounter closed.

## 2018-03-19 ENCOUNTER — Other Ambulatory Visit: Payer: Self-pay | Admitting: *Deleted

## 2018-03-19 DIAGNOSIS — D250 Submucous leiomyoma of uterus: Secondary | ICD-10-CM

## 2018-03-19 DIAGNOSIS — D4959 Neoplasm of unspecified behavior of other genitourinary organ: Secondary | ICD-10-CM

## 2018-03-19 DIAGNOSIS — N926 Irregular menstruation, unspecified: Secondary | ICD-10-CM

## 2018-04-08 ENCOUNTER — Ambulatory Visit (INDEPENDENT_AMBULATORY_CARE_PROVIDER_SITE_OTHER): Payer: 59 | Admitting: Obstetrics & Gynecology

## 2018-04-08 ENCOUNTER — Other Ambulatory Visit: Payer: 59

## 2018-04-08 ENCOUNTER — Ambulatory Visit (INDEPENDENT_AMBULATORY_CARE_PROVIDER_SITE_OTHER): Payer: 59

## 2018-04-08 VITALS — BP 126/80 | HR 68 | Resp 14 | Ht 60.0 in | Wt 126.8 lb

## 2018-04-08 DIAGNOSIS — D4959 Neoplasm of unspecified behavior of other genitourinary organ: Secondary | ICD-10-CM | POA: Diagnosis not present

## 2018-04-08 DIAGNOSIS — D250 Submucous leiomyoma of uterus: Secondary | ICD-10-CM | POA: Diagnosis not present

## 2018-04-08 DIAGNOSIS — N926 Irregular menstruation, unspecified: Secondary | ICD-10-CM

## 2018-04-08 MED ORDER — BUSPIRONE HCL 7.5 MG PO TABS: 7.5000 mg | ORAL_TABLET | Freq: Two times a day (BID) | ORAL | 1 refills | Status: DC

## 2018-04-08 NOTE — Progress Notes (Signed)
47 y.o. G0P0000 Single Caucasian female here for pelvic ultrasound due to recheck ovaries due to hx of solid adnexal mass.  Also, has submucosal fibroid that is being followed as well.  Pt has experienced a lot of irregular spotting over the past few years.  Aware this may be related.  Patient's last menstrual period was 12/23/2017.  Contraception: OCPs  Findings:  UTERUS: 6.3 x 4.7 x 3.8cm with several fibroids measuring 8 x 28mm, 2.3 x 2.3cm, 8 x 59mm, and 10 x 42mm EMS: 5.55mm (does have appearance of 6mm submucosal fibroid ADNEXA: Left ovary: 3.2 x 2.5 x 2.1cm with 2cm ovarian mass (has appearance of ovarian fibroid on ultrasound?)       Right ovary: 2.5 x 1.6 x 1.3cm CUL DE SAC:  No free fluid  Discussion:  Findings reviewed with pt.  Solid adnexal mass is still present as well as probable submucosal fibroid.  We have previously discussed laparoscopic removal of the adnexal mass with or without ovary/tube removal depending on whether this is part of the ovary or on top of the ovary.  She has decided that she wants to proceed with this.  Also, we have discussed hysteroscopy with fibroid resection.  Procedures, risks and benefits reviewed.  Procedure discussed with patient.  Recovery and pain management discussed.  Risks discussed including but not limited to bleeding, rare risk of transfusion, infection, 1% risk of uterine perforation with risks of fluid deficit causing cardiac arrythmia, cerebral swelling and/or need to stop procedure early.  Fluid emboli and rare risk of death discussed.  DVT/PE, rare risk of risk of bowel/bladder/ureteral/vascular injury. Additional laparoscopic risks of bleeding, transfusion risk, bowel/bladder/ureteral injury all discussed.  Latient aware if pathology abnormal she may need additional treatment.  All questions answered.    Physical Exam  Constitutional: She is oriented to person, place, and time. She appears well-developed and well-nourished.  Cardiovascular:  Normal rate and regular rhythm.  Respiratory: Effort normal and breath sounds normal.  Neurological: She is alert and oriented to person, place, and time.  Skin: Skin is warm and dry.  Psychiatric: She has a normal mood and affect.   Assessment:  Solid left adnexal mass Irregular spotting Submucosal fibroid  Plan:  Laparoscopic left ovarian cystectomy, possible laparoscopic Left salpingoophorectomy, possible bilateral salpingectomy (she is considering this) with hysteroscopy and excision of intracavity lesion.  Myosure will be use.  Pt is in agreement with all of the above.    ~25 minutes spent with patient >50% of time was in face to face discussion of above.

## 2018-04-14 ENCOUNTER — Encounter: Payer: Self-pay | Admitting: Obstetrics & Gynecology

## 2018-04-14 DIAGNOSIS — D4959 Neoplasm of unspecified behavior of other genitourinary organ: Secondary | ICD-10-CM | POA: Insufficient documentation

## 2018-04-14 DIAGNOSIS — D250 Submucous leiomyoma of uterus: Secondary | ICD-10-CM

## 2018-04-14 HISTORY — DX: Submucous leiomyoma of uterus: D25.0

## 2018-04-23 ENCOUNTER — Encounter (HOSPITAL_BASED_OUTPATIENT_CLINIC_OR_DEPARTMENT_OTHER): Payer: Self-pay

## 2018-04-23 ENCOUNTER — Other Ambulatory Visit: Payer: Self-pay

## 2018-04-23 ENCOUNTER — Other Ambulatory Visit: Payer: Self-pay | Admitting: Obstetrics & Gynecology

## 2018-04-23 NOTE — Progress Notes (Signed)
Spoke with:  Magda Paganini NPO:  After Midnight, no gum, candy, or mints   Arrival time: 0530AM Labs: (CBC, HCG 04/30/2018 in epic) AM medications: Buspirone Pre op orders:  Yes Ride home:  [facility] ([place]) [phone]

## 2018-04-30 ENCOUNTER — Encounter (HOSPITAL_COMMUNITY)
Admission: RE | Admit: 2018-04-30 | Discharge: 2018-04-30 | Disposition: A | Discharge status: Home / Self Care | Payer: 59 | Source: Ambulatory Visit | Attending: Obstetrics & Gynecology | Admitting: Obstetrics & Gynecology

## 2018-04-30 ENCOUNTER — Telehealth: Payer: Self-pay | Admitting: Obstetrics & Gynecology

## 2018-04-30 DIAGNOSIS — Z01818 Encounter for other preprocedural examination: Secondary | ICD-10-CM | POA: Diagnosis not present

## 2018-04-30 DIAGNOSIS — D4959 Neoplasm of unspecified behavior of other genitourinary organ: Secondary | ICD-10-CM | POA: Diagnosis not present

## 2018-04-30 LAB — CBC
HCT: 41.4 % (ref 36.0–46.0)
HEMOGLOBIN: 14.4 g/dL (ref 12.0–15.0)
MCH: 31.7 pg (ref 26.0–34.0)
MCHC: 34.8 g/dL (ref 30.0–36.0)
MCV: 91.2 fL (ref 78.0–100.0)
Platelets: 257 10*3/uL (ref 150–400)
RBC: 4.54 MIL/uL (ref 3.87–5.11)
RDW: 12.4 % (ref 11.5–15.5)
WBC: 8.1 10*3/uL (ref 4.0–10.5)

## 2018-04-30 LAB — HCG, SERUM, QUALITATIVE: PREG SERUM: NEGATIVE

## 2018-04-30 NOTE — Telephone Encounter (Signed)
VOID

## 2018-05-02 NOTE — Anesthesia Preprocedure Evaluation (Addendum)
Anesthesia Evaluation  Patient identified by MRN, date of birth, ID band Patient awake    Reviewed: Allergy & Precautions, NPO status , Patient's Chart, lab work & pertinent test results  Airway Mallampati: I  TM Distance: >3 FB Neck ROM: Full    Dental no notable dental hx. (+) Teeth Intact, Dental Advisory Given   Pulmonary neg pulmonary ROS,    Pulmonary exam normal breath sounds clear to auscultation       Cardiovascular Exercise Tolerance: Good negative cardio ROS Normal cardiovascular exam Rhythm:Regular Rate:Normal     Neuro/Psych Anxiety negative neurological ROS     GI/Hepatic negative GI ROS, Neg liver ROS,   Endo/Other    Renal/GU      Musculoskeletal   Abdominal   Peds  Hematology   Anesthesia Other Findings   Reproductive/Obstetrics                            Anesthesia Physical Anesthesia Plan  ASA: I  Anesthesia Plan: General   Post-op Pain Management:    Induction: Intravenous  PONV Risk Score and Plan: 4 or greater and Treatment may vary due to age or medical condition, Ondansetron, Scopolamine patch - Pre-op and Dexamethasone  Airway Management Planned: Oral ETT  Additional Equipment:   Intra-op Plan:   Post-operative Plan: Extubation in OR  Informed Consent: I have reviewed the patients History and Physical, chart, labs and discussed the procedure including the risks, benefits and alternatives for the proposed anesthesia with the patient or authorized representative who has indicated his/her understanding and acceptance.   Dental advisory given  Plan Discussed with:   Anesthesia Plan Comments:         Anesthesia Quick Evaluation

## 2018-05-03 ENCOUNTER — Ambulatory Visit (HOSPITAL_BASED_OUTPATIENT_CLINIC_OR_DEPARTMENT_OTHER)
Admission: RE | Admit: 2018-05-03 | Discharge: 2018-05-03 | Disposition: A | Discharge status: Home / Self Care | Payer: 59 | Source: Ambulatory Visit | Attending: Obstetrics & Gynecology | Admitting: Obstetrics & Gynecology

## 2018-05-03 ENCOUNTER — Ambulatory Visit (HOSPITAL_BASED_OUTPATIENT_CLINIC_OR_DEPARTMENT_OTHER): Payer: 59 | Admitting: Anesthesiology

## 2018-05-03 ENCOUNTER — Encounter (HOSPITAL_BASED_OUTPATIENT_CLINIC_OR_DEPARTMENT_OTHER): Payer: Self-pay

## 2018-05-03 ENCOUNTER — Encounter (HOSPITAL_BASED_OUTPATIENT_CLINIC_OR_DEPARTMENT_OTHER)
Admission: RE | Disposition: A | Discharge status: Home / Self Care | Payer: Self-pay | Source: Ambulatory Visit | Attending: Obstetrics & Gynecology

## 2018-05-03 DIAGNOSIS — Z9842 Cataract extraction status, left eye: Secondary | ICD-10-CM | POA: Insufficient documentation

## 2018-05-03 DIAGNOSIS — D271 Benign neoplasm of left ovary: Secondary | ICD-10-CM | POA: Insufficient documentation

## 2018-05-03 DIAGNOSIS — F419 Anxiety disorder, unspecified: Secondary | ICD-10-CM | POA: Insufficient documentation

## 2018-05-03 DIAGNOSIS — N926 Irregular menstruation, unspecified: Secondary | ICD-10-CM | POA: Insufficient documentation

## 2018-05-03 DIAGNOSIS — D259 Leiomyoma of uterus, unspecified: Secondary | ICD-10-CM | POA: Diagnosis not present

## 2018-05-03 DIAGNOSIS — Z800 Family history of malignant neoplasm of digestive organs: Secondary | ICD-10-CM | POA: Diagnosis not present

## 2018-05-03 DIAGNOSIS — Z803 Family history of malignant neoplasm of breast: Secondary | ICD-10-CM | POA: Insufficient documentation

## 2018-05-03 DIAGNOSIS — Z79899 Other long term (current) drug therapy: Secondary | ICD-10-CM | POA: Insufficient documentation

## 2018-05-03 DIAGNOSIS — D250 Submucous leiomyoma of uterus: Secondary | ICD-10-CM | POA: Insufficient documentation

## 2018-05-03 DIAGNOSIS — Z801 Family history of malignant neoplasm of trachea, bronchus and lung: Secondary | ICD-10-CM | POA: Insufficient documentation

## 2018-05-03 DIAGNOSIS — D4959 Neoplasm of unspecified behavior of other genitourinary organ: Secondary | ICD-10-CM | POA: Diagnosis not present

## 2018-05-03 HISTORY — DX: Unspecified cataract: H26.9

## 2018-05-03 HISTORY — DX: Personal history of other diseases of the circulatory system: Z86.79

## 2018-05-03 HISTORY — DX: Other specified conditions associated with female genital organs and menstrual cycle: N94.89

## 2018-05-03 HISTORY — PX: DILATATION & CURETTAGE/HYSTEROSCOPY WITH MYOSURE: SHX6511

## 2018-05-03 HISTORY — DX: Deviated nasal septum: J34.2

## 2018-05-03 HISTORY — PX: LAPAROSCOPIC OVARIAN CYSTECTOMY: SHX6248

## 2018-05-03 HISTORY — DX: Irregular menstruation, unspecified: N92.6

## 2018-05-03 HISTORY — DX: Anxiety disorder, unspecified: F41.9

## 2018-05-03 HISTORY — DX: Benign lipomatous neoplasm, unspecified: D17.9

## 2018-05-03 SURGERY — EXCISION, CYST, OVARY, LAPAROSCOPIC
Anesthesia: General

## 2018-05-03 MED ORDER — ACETAMINOPHEN 500 MG PO TABS
ORAL_TABLET | ORAL | Status: AC
  Filled 2018-05-03: qty 2

## 2018-05-03 MED ORDER — BUPIVACAINE HCL 0.25 % IJ SOLN
INTRAMUSCULAR | Status: DC | PRN
  Administered 2018-05-03: 5 mL

## 2018-05-03 MED ORDER — CEFAZOLIN SODIUM-DEXTROSE 2-4 GM/100ML-% IV SOLN
INTRAVENOUS | Status: AC
  Filled 2018-05-03: qty 100

## 2018-05-03 MED ORDER — LIDOCAINE 2% (20 MG/ML) 5 ML SYRINGE
INTRAMUSCULAR | Status: AC
  Filled 2018-05-03: qty 5

## 2018-05-03 MED ORDER — PROPOFOL 10 MG/ML IV BOLUS
INTRAVENOUS | Status: AC
  Filled 2018-05-03: qty 40

## 2018-05-03 MED ORDER — LIDOCAINE-EPINEPHRINE 1 %-1:100000 IJ SOLN
INTRAMUSCULAR | Status: DC | PRN
  Administered 2018-05-03: 10 mL

## 2018-05-03 MED ORDER — ONDANSETRON HCL 4 MG/2ML IJ SOLN
INTRAMUSCULAR | Status: AC
  Filled 2018-05-03: qty 2

## 2018-05-03 MED ORDER — DEXAMETHASONE SODIUM PHOSPHATE 10 MG/ML IJ SOLN
INTRAMUSCULAR | Status: DC | PRN
  Administered 2018-05-03: 10 mg via INTRAVENOUS

## 2018-05-03 MED ORDER — IBUPROFEN 800 MG PO TABS: 800.0000 mg | ORAL_TABLET | Freq: Three times a day (TID) | ORAL | 0 refills | Status: DC | PRN

## 2018-05-03 MED ORDER — HYDROMORPHONE HCL 1 MG/ML IJ SOLN
0.2500 mg | INTRAMUSCULAR | Status: DC | PRN
  Administered 2018-05-03: 0.5 mg via INTRAVENOUS
  Filled 2018-05-03: qty 0.5

## 2018-05-03 MED ORDER — ONDANSETRON HCL 4 MG/2ML IJ SOLN
INTRAMUSCULAR | Status: DC | PRN
  Administered 2018-05-03: 4 mg via INTRAVENOUS

## 2018-05-03 MED ORDER — CLONIDINE HCL 0.2 MG PO TABS
ORAL_TABLET | ORAL | Status: AC
  Filled 2018-05-03: qty 1

## 2018-05-03 MED ORDER — SODIUM CHLORIDE 0.9 % IR SOLN
Status: DC | PRN
  Administered 2018-05-03: 3000 mL

## 2018-05-03 MED ORDER — DEXAMETHASONE SODIUM PHOSPHATE 10 MG/ML IJ SOLN
INTRAMUSCULAR | Status: AC
  Filled 2018-05-03: qty 1

## 2018-05-03 MED ORDER — HYDROCODONE-ACETAMINOPHEN 5-325 MG PO TABS: 1.0000 | ORAL_TABLET | ORAL | 0 refills | Status: AC | PRN

## 2018-05-03 MED ORDER — HYDROMORPHONE HCL 1 MG/ML IJ SOLN
INTRAMUSCULAR | Status: AC
  Filled 2018-05-03: qty 1

## 2018-05-03 MED ORDER — PROPOFOL 10 MG/ML IV BOLUS
INTRAVENOUS | Status: DC | PRN
  Administered 2018-05-03: 120 mg via INTRAVENOUS

## 2018-05-03 MED ORDER — KETOROLAC TROMETHAMINE 30 MG/ML IJ SOLN
INTRAMUSCULAR | Status: DC | PRN
  Administered 2018-05-03: 30 mg via INTRAVENOUS

## 2018-05-03 MED ORDER — FENTANYL CITRATE (PF) 100 MCG/2ML IJ SOLN
INTRAMUSCULAR | Status: DC | PRN
  Administered 2018-05-03: 100 ug via INTRAVENOUS
  Administered 2018-05-03: 25 ug via INTRAVENOUS
  Administered 2018-05-03: 50 ug via INTRAVENOUS

## 2018-05-03 MED ORDER — ROCURONIUM BROMIDE 50 MG/5ML IV SOSY
PREFILLED_SYRINGE | INTRAVENOUS | Status: DC | PRN
  Administered 2018-05-03: 40 mg via INTRAVENOUS

## 2018-05-03 MED ORDER — ACETAMINOPHEN 500 MG PO TABS
1000.0000 mg | ORAL_TABLET | Freq: Once | ORAL | Status: AC
  Administered 2018-05-03: 1000 mg via ORAL
  Filled 2018-05-03: qty 2

## 2018-05-03 MED ORDER — CEFAZOLIN SODIUM-DEXTROSE 2-4 GM/100ML-% IV SOLN
2.0000 g | INTRAVENOUS | Status: AC
  Administered 2018-05-03: 2 g via INTRAVENOUS
  Filled 2018-05-03: qty 100

## 2018-05-03 MED ORDER — ROCURONIUM BROMIDE 10 MG/ML (PF) SYRINGE
PREFILLED_SYRINGE | INTRAVENOUS | Status: AC
  Filled 2018-05-03: qty 10

## 2018-05-03 MED ORDER — SUGAMMADEX SODIUM 200 MG/2ML IV SOLN
INTRAVENOUS | Status: DC | PRN
  Administered 2018-05-03: 120 mg via INTRAVENOUS

## 2018-05-03 MED ORDER — HYDROCODONE-ACETAMINOPHEN 5-325 MG PO TABS
1.0000 | ORAL_TABLET | Freq: Once | ORAL | Status: AC
  Administered 2018-05-03: 1 via ORAL
  Filled 2018-05-03: qty 1

## 2018-05-03 MED ORDER — ACETAMINOPHEN 10 MG/ML IV SOLN
1000.0000 mg | Freq: Once | INTRAVENOUS | Status: DC | PRN
  Filled 2018-05-03: qty 100

## 2018-05-03 MED ORDER — SUGAMMADEX SODIUM 200 MG/2ML IV SOLN
INTRAVENOUS | Status: AC
  Filled 2018-05-03: qty 2

## 2018-05-03 MED ORDER — CLONIDINE HCL 0.2 MG PO TABS
0.2000 mg | ORAL_TABLET | Freq: Once | ORAL | Status: AC
  Administered 2018-05-03: 0.2 mg via ORAL
  Filled 2018-05-03: qty 1

## 2018-05-03 MED ORDER — MEPERIDINE HCL 25 MG/ML IJ SOLN
6.2500 mg | INTRAMUSCULAR | Status: DC | PRN
  Filled 2018-05-03: qty 1

## 2018-05-03 MED ORDER — LACTATED RINGERS IV SOLN
INTRAVENOUS | Status: DC
  Administered 2018-05-03: 06:00:00 via INTRAVENOUS
  Filled 2018-05-03: qty 1000

## 2018-05-03 MED ORDER — VASOPRESSIN 20 UNIT/ML IV SOLN
INTRAVENOUS | Status: DC | PRN
  Administered 2018-05-03: 4 mL via INTRAMUSCULAR

## 2018-05-03 MED ORDER — PROMETHAZINE HCL 25 MG/ML IJ SOLN
6.2500 mg | INTRAMUSCULAR | Status: DC | PRN
  Filled 2018-05-03: qty 1

## 2018-05-03 MED ORDER — MIDAZOLAM HCL 5 MG/5ML IJ SOLN
INTRAMUSCULAR | Status: DC | PRN
  Administered 2018-05-03: 2 mg via INTRAVENOUS

## 2018-05-03 MED ORDER — MIDAZOLAM HCL 2 MG/2ML IJ SOLN
INTRAMUSCULAR | Status: AC
  Filled 2018-05-03: qty 2

## 2018-05-03 MED ORDER — KETOROLAC TROMETHAMINE 30 MG/ML IJ SOLN
INTRAMUSCULAR | Status: AC
  Filled 2018-05-03: qty 1

## 2018-05-03 MED ORDER — PHENYLEPHRINE 40 MCG/ML (10ML) SYRINGE FOR IV PUSH (FOR BLOOD PRESSURE SUPPORT)
PREFILLED_SYRINGE | INTRAVENOUS | Status: DC | PRN
  Administered 2018-05-03: 80 ug via INTRAVENOUS

## 2018-05-03 MED ORDER — HYDROCODONE-ACETAMINOPHEN 7.5-325 MG PO TABS
1.0000 | ORAL_TABLET | Freq: Once | ORAL | Status: DC | PRN
  Filled 2018-05-03: qty 1

## 2018-05-03 MED ORDER — LIDOCAINE 2% (20 MG/ML) 5 ML SYRINGE
INTRAMUSCULAR | Status: DC | PRN
  Administered 2018-05-03: 50 mg via INTRAVENOUS

## 2018-05-03 MED ORDER — HYDROCODONE-ACETAMINOPHEN 5-325 MG PO TABS
1.0000 | ORAL_TABLET | Freq: Four times a day (QID) | ORAL | Status: DC | PRN
  Filled 2018-05-03: qty 1

## 2018-05-03 MED ORDER — FENTANYL CITRATE (PF) 250 MCG/5ML IJ SOLN
INTRAMUSCULAR | Status: AC
  Filled 2018-05-03: qty 5

## 2018-05-03 MED ORDER — HYDROCODONE-ACETAMINOPHEN 5-325 MG PO TABS
ORAL_TABLET | ORAL | Status: AC
  Filled 2018-05-03: qty 1

## 2018-05-03 SURGICAL SUPPLY — 80 items
ADH SKN CLS APL DERMABOND .7 (GAUZE/BANDAGES/DRESSINGS) ×2
APL SKNCLS STERI-STRIP NONHPOA (GAUZE/BANDAGES/DRESSINGS)
APL SRG 38 LTWT LNG FL B (MISCELLANEOUS)
APPLICATOR ARISTA FLEXITIP XL (MISCELLANEOUS) IMPLANT
BARRIER ADHS 3X4 INTERCEED (GAUZE/BANDAGES/DRESSINGS) ×3 IMPLANT
BENZOIN TINCTURE PRP APPL 2/3 (GAUZE/BANDAGES/DRESSINGS) IMPLANT
BIPOLAR CUTTING LOOP 21FR (ELECTRODE)
BLADE CLIPPER SURG (BLADE) IMPLANT
BRR ADH 4X3 ABS CNTRL BYND (GAUZE/BANDAGES/DRESSINGS) ×2
CABLE HIGH FREQUENCY MONO STRZ (ELECTRODE) ×3 IMPLANT
CANISTER SUCT 3000ML PPV (MISCELLANEOUS) ×6 IMPLANT
CANISTER SUCTION 1200CC (MISCELLANEOUS) IMPLANT
CATH ROBINSON RED A/P 16FR (CATHETERS) ×3 IMPLANT
DECANTER SPIKE VIAL GLASS SM (MISCELLANEOUS) ×3 IMPLANT
DERMABOND ADVANCED (GAUZE/BANDAGES/DRESSINGS) ×1
DERMABOND ADVANCED .7 DNX12 (GAUZE/BANDAGES/DRESSINGS) ×2 IMPLANT
DEVICE MYOSURE LITE (MISCELLANEOUS) IMPLANT
DEVICE MYOSURE REACH (MISCELLANEOUS) ×3 IMPLANT
DILATOR CANAL MILEX (MISCELLANEOUS) ×3 IMPLANT
DRSG COVADERM PLUS 2X2 (GAUZE/BANDAGES/DRESSINGS) IMPLANT
DRSG OPSITE POSTOP 3X4 (GAUZE/BANDAGES/DRESSINGS) IMPLANT
DRSG TELFA 3X8 NADH (GAUZE/BANDAGES/DRESSINGS) IMPLANT
DURAPREP 26ML APPLICATOR (WOUND CARE) ×3 IMPLANT
GAUZE SPONGE 4X4 16PLY XRAY LF (GAUZE/BANDAGES/DRESSINGS) ×3 IMPLANT
GLOVE BIOGEL PI IND STRL 7.0 (GLOVE) ×4 IMPLANT
GLOVE BIOGEL PI INDICATOR 7.0 (GLOVE) ×2
GLOVE ECLIPSE 6.5 STRL STRAW (GLOVE) ×6 IMPLANT
GLOVE INDICATOR 7.0 STRL GRN (GLOVE) ×3 IMPLANT
GOWN STRL REUS W/ TWL LRG LVL3 (GOWN DISPOSABLE) ×2 IMPLANT
GOWN STRL REUS W/ TWL XL LVL3 (GOWN DISPOSABLE) ×2 IMPLANT
GOWN STRL REUS W/TWL LRG LVL3 (GOWN DISPOSABLE) ×6 IMPLANT
GOWN STRL REUS W/TWL XL LVL3 (GOWN DISPOSABLE) ×3
HEMOSTAT ARISTA ABSORB 3G PWDR (MISCELLANEOUS) IMPLANT
IV NS IRRIG 3000ML ARTHROMATIC (IV SOLUTION) ×6 IMPLANT
KIT PROCEDURE FLUENT (KITS) ×3 IMPLANT
KIT TURNOVER CYSTO (KITS) ×3 IMPLANT
LIGASURE VESSEL 5MM BLUNT TIP (ELECTROSURGICAL) IMPLANT
LOOP CUTTING BIPOLAR 21FR (ELECTRODE) IMPLANT
NDL INSUFFLATION 14GA 150MM (NEEDLE) IMPLANT
NEEDLE HYPO 25X1 1.5 SAFETY (NEEDLE) IMPLANT
NEEDLE INSUFFLATION 120MM (ENDOMECHANICALS) ×3 IMPLANT
NEEDLE INSUFFLATION 14GA 150MM (NEEDLE) IMPLANT
NS IRRIG 500ML POUR BTL (IV SOLUTION) ×3 IMPLANT
PACK LAPAROSCOPY BASIN (CUSTOM PROCEDURE TRAY) ×3 IMPLANT
PACK TRENDGUARD 450 HYBRID PRO (MISCELLANEOUS) ×2 IMPLANT
PACK VAGINAL MINOR WOMEN LF (CUSTOM PROCEDURE TRAY) ×3 IMPLANT
PAD OB MATERNITY 4.3X12.25 (PERSONAL CARE ITEMS) ×3 IMPLANT
POUCH LAPAROSCOPIC INSTRUMENT (MISCELLANEOUS) ×3 IMPLANT
PROTECTOR NERVE ULNAR (MISCELLANEOUS) ×3 IMPLANT
SCISSORS LAP 5X35 DISP (ENDOMECHANICALS) IMPLANT
SEAL ROD LENS SCOPE MYOSURE (ABLATOR) ×3 IMPLANT
SEALER TISSUE G2 CVD JAW 35 (ENDOMECHANICALS) IMPLANT
SEALER TISSUE G2 CVD JAW 45CM (ENDOMECHANICALS)
SET IRRIG TUBING LAPAROSCOPIC (IRRIGATION / IRRIGATOR) IMPLANT
SHEARS HARMONIC ACE PLUS 36CM (ENDOMECHANICALS) IMPLANT
SLEEVE ADV FIXATION 5X100MM (TROCAR) ×3 IMPLANT
SOLUTION ELECTROLUBE (MISCELLANEOUS) IMPLANT
STRIP CLOSURE SKIN 1/4X4 (GAUZE/BANDAGES/DRESSINGS) IMPLANT
SUT VIC AB 4-0 SH 27 (SUTURE)
SUT VIC AB 4-0 SH 27XANBCTRL (SUTURE) IMPLANT
SUT VICRYL 0 UR6 27IN ABS (SUTURE) ×3 IMPLANT
SUT VICRYL 4-0 PS2 18IN ABS (SUTURE) ×3 IMPLANT
SYR 10ML LL (SYRINGE) ×3 IMPLANT
SYR 30ML LL (SYRINGE) ×2 IMPLANT
SYR 3ML 23GX1 SAFETY (SYRINGE) IMPLANT
SYS BAG RETRIEVAL 10MM (BASKET)
SYS RETRIEVAL 5MM INZII UNIV (BASKET) ×3
SYSTEM BAG RETRIEVAL 10MM (BASKET) IMPLANT
SYSTEM CARTER THOMASON II (TROCAR) ×3 IMPLANT
SYSTEM RETRIEVL 5MM INZII UNIV (BASKET) ×1 IMPLANT
TOWEL OR 17X24 6PK STRL BLUE (TOWEL DISPOSABLE) ×6 IMPLANT
TRAY FOLEY W/BAG SLVR 14FR (SET/KITS/TRAYS/PACK) ×3 IMPLANT
TRENDGUARD 450 HYBRID PRO PACK (MISCELLANEOUS) ×3
TROCAR ADV FIXATION 5X100MM (TROCAR) ×3 IMPLANT
TROCAR XCEL NON-BLD 11X100MML (ENDOMECHANICALS) IMPLANT
TROCAR XCEL NON-BLD 5MMX100MML (ENDOMECHANICALS) ×3 IMPLANT
TUBE CONNECTING 12X1/4 (SUCTIONS) ×2 IMPLANT
TUBING INSUF HEATED (TUBING) ×3 IMPLANT
WARMER LAPAROSCOPE (MISCELLANEOUS) ×3 IMPLANT
WATER STERILE IRR 500ML POUR (IV SOLUTION) ×3 IMPLANT

## 2018-05-03 NOTE — H&P (Signed)
Sarah George is an 47 y.o. female G0 SWF here for hysteroscopic resection of submucosal fibroid and removal of 2cm left ovarian mass that has occurred sometime in the last four years.  Ultrasound 7/15 did not show this ovarian mass.  Recent ultrasound in May and again in August did confirm the present of the ovarian mass.  Ultrasound was done due to irregular bleeding.  She does have uterine fibroids but does not want to do anything about the intramural fibroids because bleeding is controlled with OCPs except for irregular spotting.  Due to the presence of the new solid ovarian mass and the submucosal fibroid, removal recommended.  Other than monitoring, there really is no alternative.  Risks and benefits have been discussed.  Pt here and ready to proceed.  Pertinent Gynecological History: Menses: regular with intermentrual spotting Contraception: OCP (estrogen/progesterone) DES exposure: denies Blood transfusions: none Sexually transmitted diseases: no past history Previous GYN Procedures: none  Last mammogram: normal Date: 11/18 Last pap: normal Date: 1/19 OB History: G0, P0   Menstrual History: No LMP recorded. (Menstrual status: Oral contraceptives).    Past Medical History:  Diagnosis Date  . Adnexal mass     2cm ovarian mass   . Anxiety   . Deviated nasal septum   . Fibroids, submucosal 04/14/2018   several fibroids measuring 8 x 28mm, 2.3 x 2.3cm, 8 x 38mm, and 10 x 82mm  . History of abnormal Pap smear 2/93   LEEP, CIN I  . History of spider veins   . Irregular uterine bleeding   . Left cataract    History of   . Lipoma 03/24/2014   Left supraclavicular    Past Surgical History:  Procedure Laterality Date  . CATARACT EXTRACTION Left 2012  . LEEP  [name]  . Sclerotherapy  11/06/2015  . Septorhinoplasty  01/23/2005  . TONSILLECTOMY  11/14    Family History  Problem Relation Age of Onset  . Breast cancer Maternal Aunt   . Breast  cancer Cousin        maternal  . Colon cancer Mother 41  . Colon cancer Maternal Grandmother   . Colon cancer Maternal Grandfather   . Colon cancer Maternal Uncle   . Lung cancer Paternal Grandfather     Social History:  reports that she has never smoked. She has never used smokeless tobacco. She reports that she drinks about 1.0 - 2.0 standard drinks of alcohol per week. She reports that she does not use drugs.  Allergies: No Known Allergies  Medications Prior to Admission  Medication Sig Dispense Refill Last Dose  . busPIRone (BUSPAR) 7.5 MG tablet Take 1 tablet (7.5 mg total) by mouth 2 (two) times daily. 60 tablet 1 05/02/2018 at Unknown time  . Calcium-Magnesium-Vitamin D (CALCIUM MAGNESIUM PO) Take by mouth.   05/02/2018 at Unknown time  . hydrocortisone 2.5 % cream daily as needed.  0 Past Month at Unknown time  . norethindrone-ethinyl estradiol (MICROGESTIN) 1-20 MG-MCG tablet Take 1 tab daily, skipping placebo.  Takes continuous active. (Patient taking differently: Take 1 tablet by mouth daily. ) 4 Package 4 05/02/2018 at Unknown time  . Biotin 5 MG CAPS Take by mouth daily.   More than a month at Unknown time  . Cholecalciferol (VITAMIN D PO) Take 1,000 Int'l Units by mouth.    More than a month at Unknown time  . Multiple Vitamin (MULTIVITAMIN) tablet Take 1 tablet by mouth  daily.   More than a month at Unknown time  . temazepam (RESTORIL) 15 MG capsule TAKE ONE CAPSULE BY MOUTH AT BEDTIME AS NEEDED FOR SLEEP (Patient taking differently: at bedtime as needed. ) 30 capsule 0 04/28/2018  . valACYclovir (VALTREX) 1000 MG tablet Take 1,000 mg by mouth as needed.   More than a month at Unknown time    Review of Systems  All other systems reviewed and are negative.   Blood pressure (!) 143/97, pulse 79, temperature 98.5 F (36.9 C), temperature source Oral, resp. rate 16, height 5' (1.524 m), weight 57.3 kg, SpO2 100 %. Physical Exam  Constitutional: She is oriented to person,  place, and time. She appears well-developed and well-nourished.  Cardiovascular: Normal rate and regular rhythm.  Respiratory: Effort normal and breath sounds normal.  Neurological: She is alert and oriented to person, place, and time.  Skin: Skin is warm and dry.  Psychiatric: She has a normal mood and affect.    No results found for this or any previous visit (from the past 24 hour(s)).  No results found.  Assessment/Plan: 47 yo GO SWF with 2cm left ovarian mass and submucous fibroid here for laparoscopic resection and hysteroscopic resection of both.  Risks and benefits have been discussed.  Pt is here and ready to proceed.  Megan Salon 05/03/2018, 7:20 AM

## 2018-05-03 NOTE — Transfer of Care (Signed)
Immediate Anesthesia Transfer of Care Note  Patient: Sarah George  Procedure(s) Performed: LAPAROSCOPIC OVARIAN CYSTECTOMY  possible LSO (Left ) DILATATION & CURETTAGE/HYSTEROSCOPY WITH MYOSURE (N/A )  Patient Location: PACU  Anesthesia Type:General  Level of Consciousness: drowsy  Airway & Oxygen Therapy: Patient Spontanous Breathing and Patient connected to nasal cannula oxygen  Post-op Assessment: Report given to RN  Post vital signs: Reviewed and stable  Last Vitals:  Vitals Value Taken Time  BP 108/59 05/03/2018  9:41 AM  Temp    Pulse 90 05/03/2018  9:42 AM  Resp 10 05/03/2018  9:42 AM  SpO2 100 % 05/03/2018  9:42 AM  Vitals shown include unvalidated device data.  Last Pain:  Vitals:   05/03/18 0545  TempSrc:   PainSc: 0-No pain      Patients Stated Pain Goal: 5 (03/07/81 8833)  Complications: No apparent anesthesia complications

## 2018-05-03 NOTE — Anesthesia Procedure Notes (Signed)
Procedure Name: Intubation Date/Time: 05/03/2018 7:39 AM Performed by: Bonney Aid, CRNA Pre-anesthesia Checklist: Patient identified, Emergency Drugs available, Suction available and Patient being monitored Patient Re-evaluated:Patient Re-evaluated prior to induction Oxygen Delivery Method: Circle system utilized Preoxygenation: Pre-oxygenation with 100% oxygen Induction Type: IV induction Ventilation: Mask ventilation without difficulty Laryngoscope Size: Mac and 3 Grade View: Grade I Tube type: Oral Number of attempts: 1 Airway Equipment and Method: Stylet Placement Confirmation: ETT inserted through vocal cords under direct vision,  positive ETCO2 and breath sounds checked- equal and bilateral Secured at: 21 cm Tube secured with: Tape Dental Injury: Teeth and Oropharynx as per pre-operative assessment

## 2018-05-03 NOTE — Op Note (Signed)
05/03/2018  9:31 AM  PATIENT:  Sarah George  47 y.o. female  PRE-OPERATIVE DIAGNOSIS:  ovarain mass, submucosal fibroid, irregular bleeding  POST-OPERATIVE DIAGNOSIS:  ovarain mass most likely a fibroid, submucosal fibroid, irregular bleeding  PROCEDURE:  Procedure(s): LAPAROSCOPIC LEFT OVARIAN MASS REMOVAL, HYSTEROSCOPY WITH RESECTION OF FIBROID  SURGEON:  Megan Salon  ASSISTANTS: Sumner Boast, MD   ANESTHESIA:   general  ESTIMATED BLOOD LOSS: 10 mL  BLOOD ADMINISTERED:none   FLUIDS: 700cc LR  UOP: 150cc Clear  SPECIMEN:  Ovarian mass (likely a fibroid), submucosal fibroid  DISPOSITION OF SPECIMEN:  PATHOLOGY  FINDINGS: normal right ovary and tube, normal appearing upper abdomen, normal left fallopian tube, left ovary with round, solid mass c/w fibroid, endometrial cavity with thin endometrium, submucosal fibroid  DESCRIPTION OF OPERATION: Patient is taken to the operating room. She is placed in the supine position. She is a running IV in place. Informed consent was present on the chart. SCDs on her lower extremities and functioning properly. Patient was positioned while she was awake.  Her legs were placed in the low lithotomy position in [place]. Her arms were tucked by the side.  General endotracheal anesthesia was administered by the anesthesia staff without difficulty.  Time out performed.    Chlora prep was then used to prep the abdomen and Betadine was used to prep the inner thighs, perineum and vagina. Once 3 minutes had past the patient was draped in a normal standard fashion. The legs were lifted to the high lithotomy position. The cervix was visualized by placing a heavy weighted speculum in the posterior aspect of the vagina and using a curved Deaver retractor to the retract anteriorly. The anterior lip of the cervix was grasped with single-tooth tenaculum.  The cervix sounded to 6.5 cm.  Os finder was used to dilate the cervix as I could not pass the  Hulka clamp through the cervix.  At this point the Hulka clamp could be passed through the cervix and it was attached to the anterior lip of the cervix as a means of manipulating the uterus during the procedure.  The tenaculum was removed.  Speculum was removed.  Foley catheter was placed straight drain.  Clear urine was noted and legs were lowered to the lower lithotomy position.  Attention was turned the abdomen.  The umbilicus was everted.  A Veress needle was obtained. Syringe of sterile saline was placed on a open Veress needle.  This was passed into the umbilicus until just when the fluid started to drip.  Then low flow CO2 gas was attached the needle and the pneumoperitoneum was achieved without difficulty. Once 2.5 liters of gas was in the abdomen the Veress needle was removed and a 5 millimeter non-bladed Optiview trocar and port were passed directly to the abdomen. The laparoscope was then used to confirm intraperitoneal placement.  I mildly enlarged uterus with posterior fibroids noted.  Both ovaries were seen.  The left ovary was noted with the solid-appearing mass that was noted on ultrasound.  Otherwise the tubes and ovaries appeared normal in the upper abdomen was normal. Locations for RLQ and LLQ ports were noted by transillumination of the abdominal wall.  0.25% marcaine was used to anesthetize the skin.  3 mm skin incisions were made on both sides and 3 mm nondisposable trochars and ports were passed under direct visualization of the laparoscope.  The trochars were removed.  An attempt was made to grasp the mass on the left side  with the available 3 mm instruments but ultimately no instrument held the mass adequately.  So the port was removed.  The skin incision was slightly enlarged.  A 5 mm non-bladed trocar port were passed under direct visualization.  Then the mass was injected with 5 cc of Pitressin.  20 cc of Pitressin and 100 cc of normal saline was mixed for the injection.  Then the mass  was grasped with a tenaculum.  It did appear to be a fibroid.  Using the harmonic scalpel the capsule was opened.  The filmy tissue connecting the ovary to the fibroid was dissected.  Inferiorly on the mass there were some vessels that were noted.  These were grasped with the harmonic scalpel and cauterized on a low setting.  At this point the mass was free.  3 mm laparoscope was used in the right lower quadrant port while a 5 mm bag was passed through the umbilical port.  The mass was placed in the bag and the umbilical port was removed.  The bag was brought up through the umbilicus.  The fascia was extended just a little bit.  The mass was grasped in the bag with Coker clamp and it was incised until small enough to to bring through the incision.  The 5 mm port was passed back to the umbilicus.  The ovary was irrigated.  No bleeding was noted.   CO2 gas was decreased to 5 mmHg in the area of dissection was visualized to ensure there was no delayed bleeding.  This was normal.  Then an Interceed was wrapped around the left ovary and the location where the fibroid was resected.  At this point the umbilical port was removed.  The fascia was closed using a Minette Headland fascial closure device.  Excellent closure was noted.  At this point the laparoscopic portion of the procedure was ended.  All instruments were removed.  The right lower quadrant port was removed.  The pneumoperitoneum was relieved.  Patient was taken out left Trendelenburg positioning.  Several deep breaths were given by the CRNA to ensure as much CO2 gas was out of the abdomen is possible.  In the left lower quadrant port was removed.  The incisions were closed with subcuticular stitches of #4-0 Vicryl.  The incisions were cleansed and Dermabond was applied.  Attention was turned to the second portion of the procedure.  The patient's legs were lifted to the high lithotomy position and the Hulka clamp was removed from the anterior lip of the  cervix.  next the anterior lip of the cervix was grasped with single-tooth tenaculum.  A paracervical block of 1% lidocaine mixed one-to-one with epinephrine (1:100,000 units).  10 cc was used total. The cervix is dilated up to #7 hagar dilators.  The XL myosure hysteroscope was passed to the cervix and into endometrial cavity.  Normal saline was used as a hysteroscopic fluid. The tubal ostia were noted bilaterally.  A submucosal fibroid that was arising from the fundus of the uterus was noted.  The MyoSure reach device was then passed to the hysteroscope and the fibroid was resected until it was smooth with fundus of the uterus.  At this point random endometrial sampling was obtained with the scope before removal.  There was no bleeding noted from the area of resection.  The hysteroscope was removed. The fluid deficit was 80 cc. The tenaculum was removed from the anterior lip of the cervix. The speculum was removed from the vagina. The  prep was cleansed of the patient's skin. The legs are positioned back in the supine position. Sponge, lap, needle, initially counts were correct x2. Patient was taken to recovery in stable condition.  COUNTS:  YES  PLAN OF CARE: Transfer to PACU

## 2018-05-03 NOTE — Discharge Instructions (Addendum)
Post-surgical Instructions, Outpatient Surgery  You may expect to feel dizzy, weak, and drowsy for as long as 24 hours after receiving the medicine that made you sleep (anesthetic). For the first 24 hours after your surgery:    Do not drive a car, ride a bicycle, participate in physical activities, or take public transportation until you are done taking narcotic pain medicines or as directed by Dr. Sabra Heck.   Do not drink alcohol or take tranquilizers.   Do not take medicine that has not been prescribed by your physicians.   Do not sign important papers or make important decisions while on narcotic pain medicines.   Have a responsible person with you.   PAIN MANAGEMENT  Motrin 800mg .  (This is the same as 4-200mg  over the counter tablets of Motrin or ibuprofen.)  You may take this every eight hours or as needed for cramping.    Vicodin 5/325mg .  For more severe pain, take one or two tablets every four to six hours as needed for pain control.  (Remember that narcotic pain medications increase your risk of constipation.  You can start Colace 100mg  twice daily or Miralax daily until you are having regular bowel movements.    DO'S AND DON'T'S  Do not take a tub bath for one week.  You may shower on the first day after your surgery  Do not do any heavy lifting for one to two weeks.  This increases the chance of bleeding.  Do move around as you feel able.  Stairs are fine.  You may begin to exercise again as you feel able.  Do not lift any weights for two weeks.  Do not put anything in the vagina for two weeks--no tampons, intercourse, or douching.    REGULAR MEDIATIONS/VITAMINS:  You may restart all of your regular medications as prescribed.  You may restart all of your vitamins as you normally take them.    PLEASE CALL OR SEEK MEDICAL CARE IF:  You have persistent nausea and vomiting.   You have trouble eating or drinking.   You have an oral temperature above 100.5.   You have  constipation that is not helped by adjusting diet or increasing fluid intake. Pain medicines are a common cause of constipation.   You have heavy vaginal bleeding.    Post Anesthesia Home Care Instructions  Activity: Get plenty of rest for the remainder of the day. A responsible individual must stay with you for 24 hours following the procedure.  For the next 24 hours, DO NOT: -Drive a car -Paediatric nurse -Drink alcoholic beverages -Take any medication unless instructed by your physician -Make any legal decisions or sign important papers.  Meals: Start with liquid foods such as gelatin or soup. Progress to regular foods as tolerated. Avoid greasy, spicy, heavy foods. If nausea and/or vomiting occur, drink only clear liquids until the nausea and/or vomiting subsides. Call your physician if vomiting continues.  Special Instructions/Symptoms: Your throat may feel dry or sore from the anesthesia or the breathing tube placed in your throat during surgery. If this causes discomfort, gargle with warm salt water. The discomfort should disappear within 24 hours.  If you had a scopolamine patch placed behind your ear for the management of post- operative nausea and/or vomiting:  1. The medication in the patch is effective for 72 hours, after which it should be removed.  Wrap patch in a tissue and discard in the trash. Wash hands thoroughly with soap and water. 2. You may  earlier than 72 hours if you experience unpleasant side effects which may include dry mouth, dizziness or visual disturbances. 3. Avoid touching the patch. Wash your hands with soap and water after contact with the patch.   

## 2018-05-04 ENCOUNTER — Encounter (HOSPITAL_BASED_OUTPATIENT_CLINIC_OR_DEPARTMENT_OTHER): Payer: Self-pay | Admitting: Obstetrics & Gynecology

## 2018-05-05 NOTE — Anesthesia Postprocedure Evaluation (Signed)
Anesthesia Post Note  Patient: Sarah George  Procedure(s) Performed: LAPAROSCOPIC OVARIAN CYSTECTOMY  possible LSO (Left ) DILATATION & CURETTAGE/HYSTEROSCOPY WITH MYOSURE (N/A )     Patient location during evaluation: PACU Anesthesia Type: General Level of consciousness: awake and alert Pain management: pain level controlled Vital Signs Assessment: post-procedure vital signs reviewed and stable Respiratory status: spontaneous breathing, nonlabored ventilation, respiratory function stable and patient connected to nasal cannula oxygen Cardiovascular status: blood pressure returned to baseline and stable Postop Assessment: no apparent nausea or vomiting Anesthetic complications: no    Last Vitals:  Vitals:   05/03/18 1045 05/03/18 1142  BP:  107/69  Pulse: 83 80  Resp: 13 14  Temp:  36.8 C  SpO2: 99% 98%    Last Pain:  Vitals:   05/04/18 1254  TempSrc:   PainSc: 4                  Barnet Glasgow

## 2018-05-07 ENCOUNTER — Telehealth: Payer: Self-pay | Admitting: Obstetrics & Gynecology

## 2018-05-07 NOTE — Telephone Encounter (Signed)
Patient left voicemail stating that she was having severe constipation after having surgery.

## 2018-05-07 NOTE — Telephone Encounter (Signed)
Reviewed with Dr. Sabra Heck. Call returned to patient. Patient states she feel like the stool is "right there in rectum". Advised To take Mirilax daily, can try dulcolax suppository or enema for relief.   Patient states she did receive message from Dr. Sabra Heck regarding results, no additional questions.   Patient aware to return call of any additional questions/concerns.   Routing to provider for final review. Patient is agreeable to disposition. Will close encounter.

## 2018-05-07 NOTE — Telephone Encounter (Addendum)
Spoke with patient. Patient is s/p LSO. Reports constipation, has had issues with this in the past.  Small hard BM last night with straining. Has been taking colace tid and increasing fluids since 9/17. Increased abdominal discomfort with straining. Bleeding is minimal, describes as spotting. Abdomen is soft. Denies N/V, fever/chills. Stopped taking Norco on 9/18. Advised I will review with Dr. Sabra Heck and return call. Patient agreeable.

## 2018-05-14 ENCOUNTER — Ambulatory Visit (INDEPENDENT_AMBULATORY_CARE_PROVIDER_SITE_OTHER): Payer: 59 | Admitting: Obstetrics & Gynecology

## 2018-05-14 ENCOUNTER — Encounter: Payer: Self-pay | Admitting: Obstetrics & Gynecology

## 2018-05-14 VITALS — BP 122/64 | HR 66 | Resp 14 | Ht 60.0 in | Wt 125.6 lb

## 2018-05-14 DIAGNOSIS — D219 Benign neoplasm of connective and other soft tissue, unspecified: Secondary | ICD-10-CM

## 2018-05-14 DIAGNOSIS — D4959 Neoplasm of unspecified behavior of other genitourinary organ: Secondary | ICD-10-CM

## 2018-05-14 NOTE — Progress Notes (Signed)
Post Operative Visit  Procedure: Laparoscopic Ovarian cystectomy, D&C Hysteroscopy with Myosure.  Days Post-op: 11 She is not having any bleeding or spotting   Subjective: Doing well.  Back at work.  Energy is good.  Did have some post op spotting for about a week.  Did have significant constipation, as she expected.  Started taking colace immediately post op.  Used an enema and this worked.  Had multiple bowel movements over the weekend.  Bowel function is back to normal now.  Emptying bladder fine.  Stopped pain medication after two days.    Objective: BP 122/64   Pulse 66   Resp 14   Ht 5' (1.524 m)   Wt 125 lb 9.6 oz (57 kg)   BMI 24.53 kg/m   EXAM General: alert, cooperative and no distress Resp: clear to auscultation bilaterally Cardio: regular rate and rhythm, S1, S2 normal, no murmur, click, rub or gallop GI: soft, non-tender; bowel sounds normal; no masses,  no organomegaly and incision: clean, dry and intact Extremities: extremities normal, atraumatic, no cyanosis or edema Vaginal Bleeding: none  Gyn:  NAEFG, vagina is without lesions, no discharge or bleeding, no CMT  Assessment: s/p laparoscopic left ovarian fibroid removal and hysteroscopic myomectomy  Plan: Activity recommendations dicussed.  She will return for AEX as scheduled.

## 2018-06-06 ENCOUNTER — Other Ambulatory Visit: Payer: Self-pay | Admitting: Obstetrics and Gynecology

## 2018-06-07 NOTE — Telephone Encounter (Signed)
Medication refill request: Restoril 15 mg  Last AEX:  09/11/17 SM Next AEX: 11/19/18 SM Last MMG (if hormonal medication request): 07/02/17 BIRADS1:Neg  Refill authorized: 02/19/18 #30/0R. Today please advise.

## 2018-09-07 ENCOUNTER — Other Ambulatory Visit: Payer: Self-pay | Admitting: Obstetrics & Gynecology

## 2018-09-08 NOTE — Telephone Encounter (Signed)
Medication refill request: OCP  Last AEX:  09/11/17 SM Next AEX: 11/19/18 Last MMG (if hormonal medication request): 07/02/17 BIRADS 1 negative/density c  Refill authorized: 09/11/17 #4 w/4 refills; today please advise Refill order has been adjusted to last patient until her AEX if authorized.

## 2018-09-28 ENCOUNTER — Encounter: Payer: Self-pay | Admitting: Obstetrics & Gynecology

## 2018-09-28 ENCOUNTER — Telehealth: Payer: Self-pay | Admitting: Obstetrics & Gynecology

## 2018-09-28 NOTE — Telephone Encounter (Signed)
Left message regarding upcoming appointment has been canceled and needs to be rescheduled. °

## 2018-11-16 ENCOUNTER — Other Ambulatory Visit: Payer: Self-pay | Admitting: Obstetrics & Gynecology

## 2018-11-16 NOTE — Telephone Encounter (Signed)
Medication refill request: restoril   Last AEX:  09/11/17 SM Next AEX: 12/17/18  Last MMG (if hormonal medication request): 2018 Refill authorized: 06/07/18 #30caps/0R. Today please advise.

## 2018-11-19 ENCOUNTER — Encounter

## 2018-11-19 ENCOUNTER — Ambulatory Visit: Payer: 59 | Admitting: Obstetrics & Gynecology

## 2018-12-11 IMAGING — DX DG CHEST 2V
2 series · 2 of 2 positions shown · non-contrast
Comparison: None.

CLINICAL DATA: Cough.  Dyspnea on exertion.

EXAM:
CHEST  2 VIEW

[dg chest 2 view (1 of 2)]
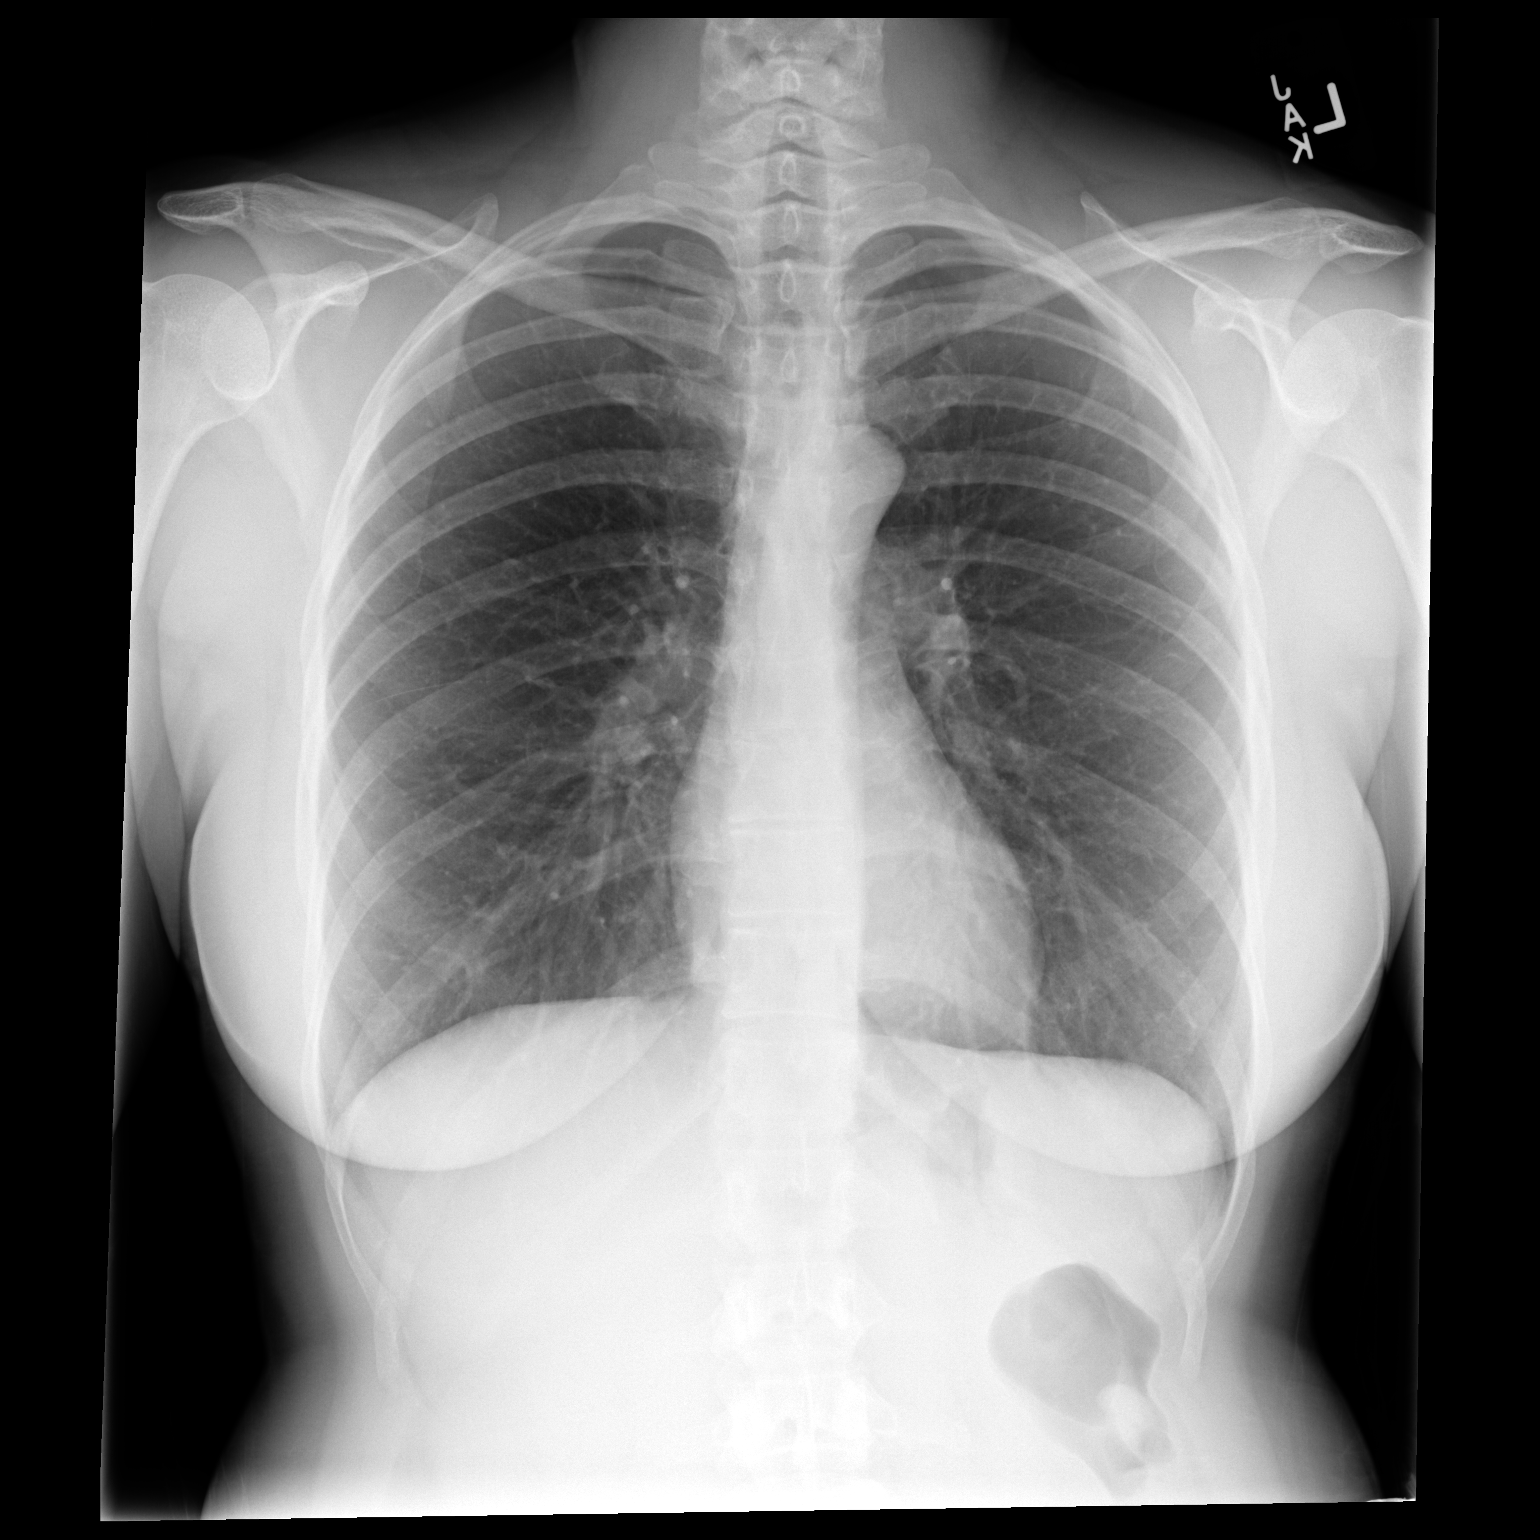

[dg chest 2 view (2 of 2)]
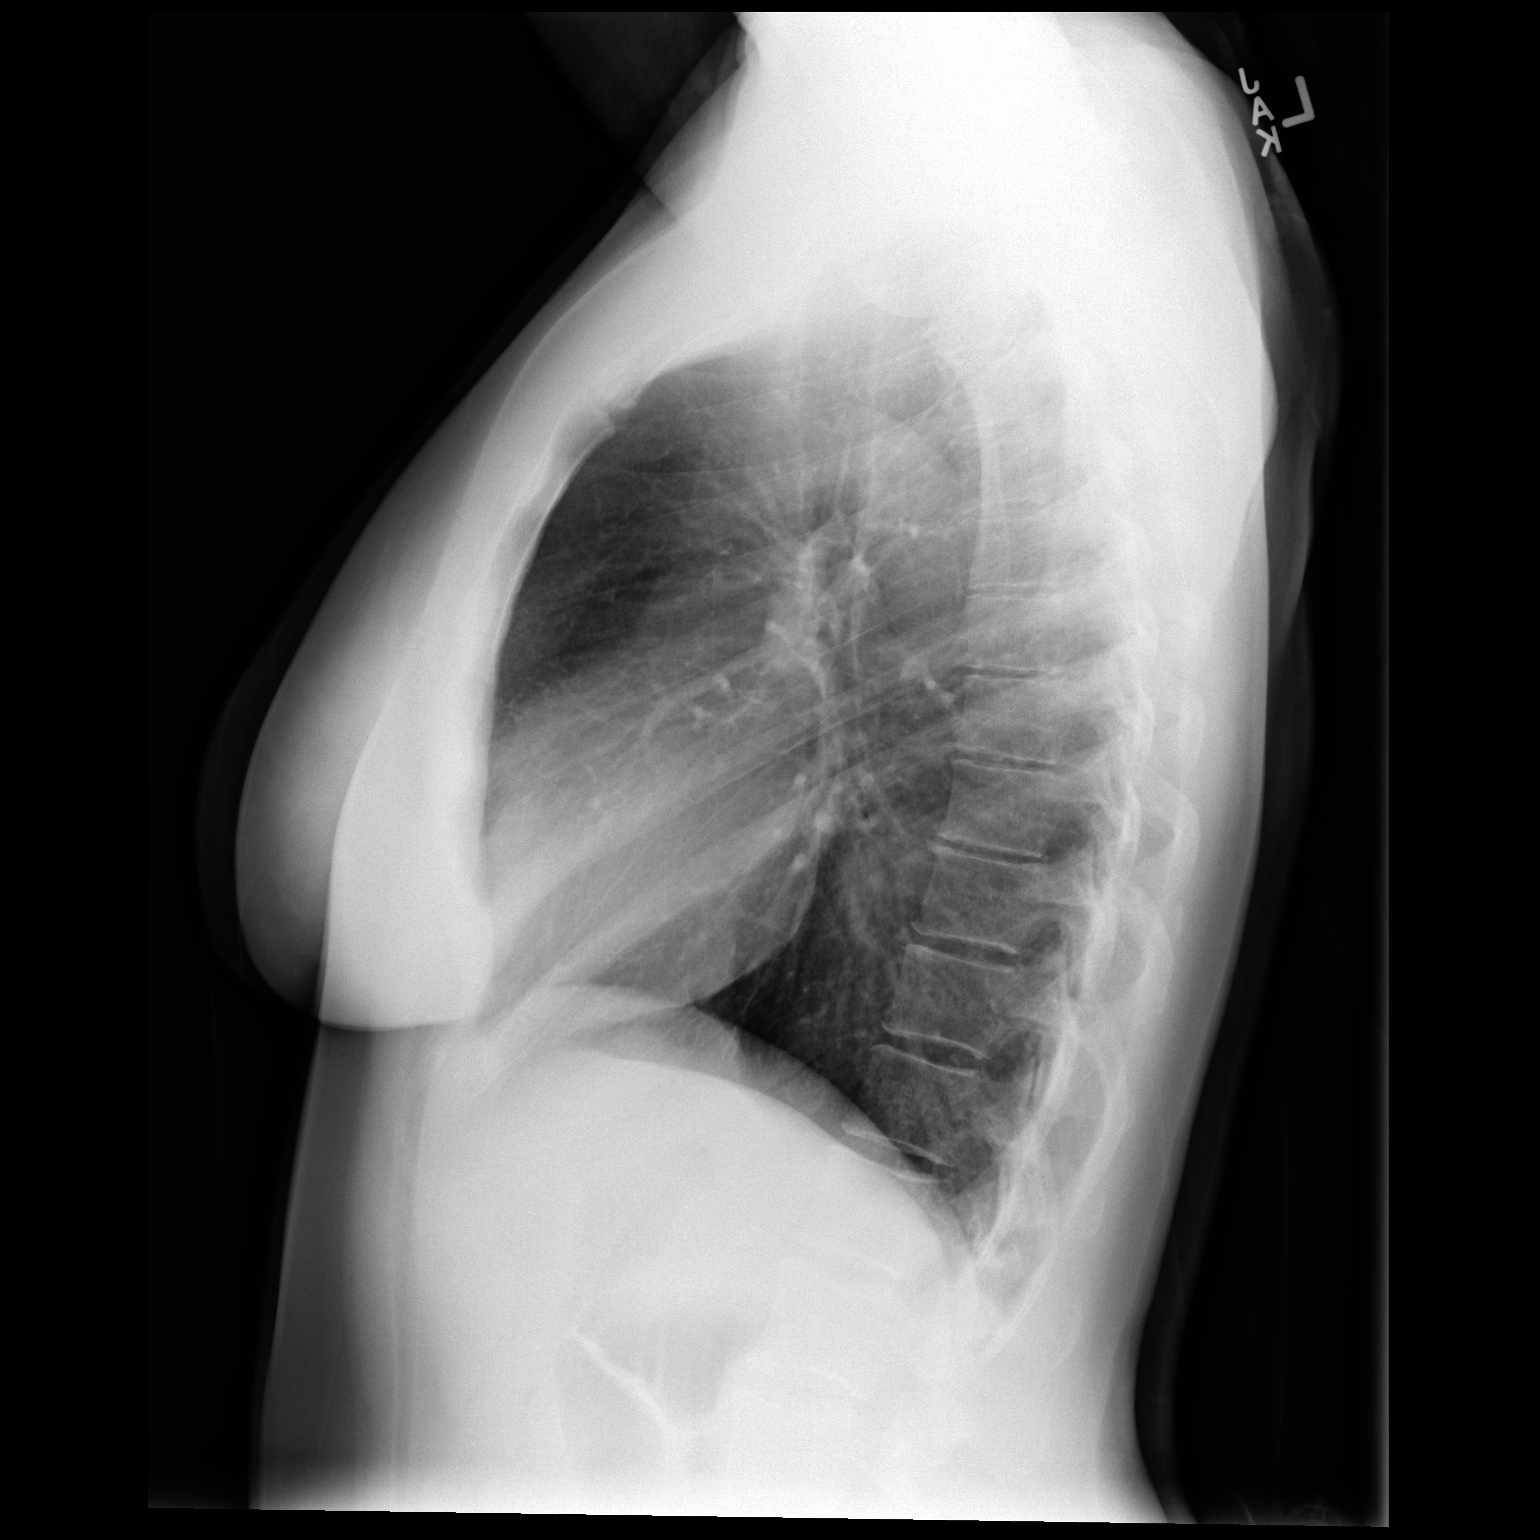

[2 of 2 positions shown; findings below may reference images not displayed]

FINDINGS: The heart size and mediastinal contours are within normal limits.
Both lungs are clear. The visualized skeletal structures are
unremarkable.
IMPRESSION: Normal exam.

## 2018-12-17 ENCOUNTER — Ambulatory Visit: Payer: 59 | Admitting: Obstetrics & Gynecology

## 2018-12-20 ENCOUNTER — Other Ambulatory Visit: Payer: Self-pay | Admitting: Obstetrics & Gynecology

## 2018-12-20 NOTE — Telephone Encounter (Signed)
Medication refill request: microgestin 1/20 Last AEX:  09-11-17, surgery done 05-03-18 Next AEX: 12-17-2018 cancelled Last MMG (if hormonal medication request): 07-02-17 neg Refill authorized: please approve or deny as appropriate.

## 2019-03-09 ENCOUNTER — Other Ambulatory Visit: Payer: Self-pay | Admitting: Obstetrics & Gynecology

## 2019-03-09 DIAGNOSIS — Z1231 Encounter for screening mammogram for malignant neoplasm of breast: Secondary | ICD-10-CM

## 2019-03-11 ENCOUNTER — Other Ambulatory Visit: Payer: Self-pay | Admitting: Obstetrics & Gynecology

## 2019-03-11 NOTE — Telephone Encounter (Signed)
Medication refill request: Microgestin Last AEX:  09/11/17 Next AEX: 03/21/19 Last MMG (if hormonal medication request): 07/02/17 Bi-rads 1 neg  Refill authorized: #28 with 0 RF

## 2019-03-21 ENCOUNTER — Encounter: Payer: Self-pay | Admitting: Obstetrics & Gynecology

## 2019-03-21 ENCOUNTER — Other Ambulatory Visit: Payer: Self-pay

## 2019-03-21 ENCOUNTER — Ambulatory Visit (INDEPENDENT_AMBULATORY_CARE_PROVIDER_SITE_OTHER): Payer: No Typology Code available for payment source | Admitting: Obstetrics & Gynecology

## 2019-03-21 VITALS — BP 126/90 | HR 80 | Temp 97.7°F | Ht 59.75 in | Wt 128.0 lb

## 2019-03-21 DIAGNOSIS — Z01419 Encounter for gynecological examination (general) (routine) without abnormal findings: Secondary | ICD-10-CM

## 2019-03-21 DIAGNOSIS — Z23 Encounter for immunization: Secondary | ICD-10-CM | POA: Diagnosis not present

## 2019-03-21 DIAGNOSIS — Z0000 Encounter for general adult medical examination without abnormal findings: Secondary | ICD-10-CM | POA: Diagnosis not present

## 2019-03-21 DIAGNOSIS — N912 Amenorrhea, unspecified: Secondary | ICD-10-CM

## 2019-03-21 DIAGNOSIS — Z1211 Encounter for screening for malignant neoplasm of colon: Secondary | ICD-10-CM | POA: Diagnosis not present

## 2019-03-21 MED ORDER — NORETHINDRONE ACET-ETHINYL EST 1-20 MG-MCG PO TABS: 1.0000 | ORAL_TABLET | Freq: Every day | ORAL | 4 refills | Status: DC

## 2019-03-21 MED ORDER — VALACYCLOVIR HCL 1 G PO TABS: 1000.0000 mg | ORAL_TABLET | Freq: Two times a day (BID) | ORAL | 1 refills | Status: DC

## 2019-03-21 NOTE — Progress Notes (Signed)
48 y.o. G0P0000 Single White or Caucasian female here for annual exam.  Takes pills continuous active and is not bleeding at all.    Father moved to [place] in January.  Father is unhappy because he can't have visitors.  He has prostate cancer.    Is a pediatric Copywriter, advertising.    Still with same significant other.    No LMP recorded. (Menstrual status: Oral contraceptives).          Sexually active: Yes.    The current method of family planning is OCP (estrogen/progesterone).    Exercising: Yes.    HIT cardio Smoker:  no  Health Maintenance: Pap:  09/11/17 Neg  05/23/16 neg. HR HPV:neg  History of abnormal Pap:  Yes, 2013 MMG:  07/02/17 BIRADS1:neg.  Has appt 04/26/19  Colonoscopy:  Never BMD:   Never TDaP:  2008 Screening Labs: Would like lab work done today   reports that she has never smoked. She has never used smokeless tobacco. She reports previous alcohol use. She reports that she does not use drugs.  Past Medical History:  Diagnosis Date  . Adnexal mass     2cm ovarian mass   . Anxiety   . Deviated nasal septum   . Fibroids, submucosal 04/14/2018   several fibroids measuring 8 x 16mm, 2.3 x 2.3cm, 8 x 45mm, and 10 x 67mm  . History of abnormal Pap smear 2/93   LEEP, CIN I  . History of spider veins   . Irregular uterine bleeding   . Left cataract    History of   . Lipoma 03/24/2014   Left supraclavicular  . Ovarian neoplasm     Past Surgical History:  Procedure Laterality Date  . CATARACT EXTRACTION Left 2012  . DILATATION & CURETTAGE/HYSTEROSCOPY WITH MYOSURE N/A 05/03/2018   Procedure: DILATATION & CURETTAGE/HYSTEROSCOPY WITH MYOSURE;  Surgeon: Megan Salon, MD;  Location: [facility];  Service: Gynecology;  Laterality: N/A;  Fibroid resection  . LAPAROSCOPIC OVARIAN CYSTECTOMY Left 05/03/2018   Procedure: LAPAROSCOPIC OVARIAN CYSTECTOMY  possible LSO;  Surgeon: Megan Salon, MD;  Location: [facility];  Service: Gynecology;   Laterality: Left;  Possible LSO  . LEEP  [place]  . Sclerotherapy  11/06/2015  . Septorhinoplasty  01/23/2005  . TONSILLECTOMY  11/14    Current Outpatient Medications  Medication Sig Dispense Refill  . Cholecalciferol (VITAMIN D PO) Take 1,000 Int'l Units by mouth.     Marland Kitchen MICROGESTIN 1-20 MG-MCG tablet TAKE 1 TABLET BY MOUTH EVERY DAY 84 tablet 0  . Multiple Vitamin (MULTIVITAMIN) tablet Take 1 tablet by mouth daily.    . temazepam (RESTORIL) 15 MG capsule TAKE 1 CAPSULE BY MOUTH AT BEDTIME AS NEEDED FOR SLEEP 30 capsule 0  . valACYclovir (VALTREX) 1000 MG tablet Take 1,000 mg by mouth as needed.     No current facility-administered medications for this visit.    Family History  Problem Relation Age of Onset  . Breast cancer Maternal Aunt   . Breast cancer Cousin        maternal  . Colon cancer Mother 54  . Colon cancer Maternal Grandmother   . Colon cancer Maternal Grandfather   . Colon cancer Maternal Uncle   . Lung cancer Paternal Grandfather    Review of Systems  All other systems reviewed and are negative.  Exam:   BP 126/90   Pulse 80   Temp 97.7 F (  36.5 C) (Temporal)   Ht 4' 11.75" (1.518 m)   Wt 128 lb (58.1 kg)   BMI 25.21 kg/m   Height: 4' 11.75" (151.8 cm)  Ht Readings from Last 3 Encounters:  03/21/19 4' 11.75" (1.518 m)  05/14/18 5' (1.524 m)  05/03/18 5' (1.524 m)    General appearance: alert, cooperative and appears stated age Head: Normocephalic, without obvious abnormality, atraumatic Neck: no adenopathy, supple, symmetrical, trachea midline and thyroid normal to inspection and palpation Lungs: clear to auscultation bilaterally Breasts: normal appearance, no masses or tenderness Heart: regular rate and rhythm Abdomen: soft, non-tender; bowel sounds normal; no masses,  no organomegaly Extremities: extremities normal, atraumatic, no cyanosis or edema Skin: Skin color, texture, turgor normal. No rashes or lesions Lymph  nodes: Cervical, supraclavicular, and axillary nodes normal. No abnormal inguinal nodes palpated Neurologic: Grossly normal  Pelvic: External genitalia:  no lesions              Urethra:  normal appearing urethra with no masses, tenderness or lesions              Bartholins and Skenes: normal                 Vagina: normal appearing vagina with normal color and discharge, no lesions              Cervix: no lesions              Pap taken: No. Bimanual Exam:  Uterus:  enlarged, 8-10 weeks size, globular and stable from last year              Adnexa: normal adnexa and no mass, fullness, tenderness               Rectovaginal: Confirms               Anus:  normal sphincter tone, no lesions  Chaperone was present for exam.  A:  Well Woman with normal exam Uterine fibroids Frustrations with weight  P:   Mammogram guidelines reviewed.  Has appt scheduled. pap smear not indicated today CBC, CMP, TSH, Vit D, Lipids AMH obtained today Tdap updated Ifob given.  ACS guidelines reviewed. RF for microgestin 1 po q day, skipping placebo pilss.  #4 packs/4RF RF for acyclovir 1gm bid x 3 days.  #6/1RF. return annually or prn

## 2019-03-24 LAB — COMPREHENSIVE METABOLIC PANEL
ALT: 9 IU/L (ref 0–32)
AST: 15 IU/L (ref 0–40)
Albumin/Globulin Ratio: 2 (ref 1.2–2.2)
Albumin: 4.3 g/dL (ref 3.8–4.8)
Alkaline Phosphatase: 44 IU/L (ref 39–117)
BUN/Creatinine Ratio: 18 (ref 9–23)
BUN: 13 mg/dL (ref 6–24)
Bilirubin Total: <0.2 mg/dL (ref 0.0–1.2)
CO2: 24 mmol/L (ref 20–29)
Calcium: 9.1 mg/dL (ref 8.7–10.2)
Chloride: 99 mmol/L (ref 96–106)
Creatinine, Ser: 0.74 mg/dL (ref 0.57–1.00)
GFR calc Af Amer: 113 mL/min/{1.73_m2} (ref >59)
GFR calc non Af Amer: 98 mL/min/{1.73_m2} (ref >59)
Globulin, Total: 2.1 g/dL (ref 1.5–4.5)
Glucose: 87 mg/dL (ref 65–99)
Potassium: 3.9 mmol/L (ref 3.5–5.2)
Sodium: 140 mmol/L (ref 134–144)
Total Protein: 6.4 g/dL (ref 6.0–8.5)

## 2019-03-24 LAB — CBC
Hematocrit: 40.5 % (ref 34.0–46.6)
Hemoglobin: 14 g/dL (ref 11.1–15.9)
MCH: 32.5 pg (ref 26.6–33.0)
MCHC: 34.6 g/dL (ref 31.5–35.7)
MCV: 94 fL (ref 79–97)
Platelets: 215 10*3/uL (ref 150–450)
RBC: 4.31 x10E6/uL (ref 3.77–5.28)
RDW: 12.6 % (ref 11.7–15.4)
WBC: 7.5 10*3/uL (ref 3.4–10.8)

## 2019-03-24 LAB — LIPID PANEL
Chol/HDL Ratio: 2.9 ratio (ref 0.0–4.4)
Cholesterol, Total: 195 mg/dL (ref 100–199)
HDL: 68 mg/dL (ref >39)
LDL Calculated: 98 mg/dL (ref 0–99)
Triglycerides: 146 mg/dL (ref 0–149)
VLDL Cholesterol Cal: 29 mg/dL (ref 5–40)

## 2019-03-24 LAB — TSH: TSH: 2.11 u[IU]/mL (ref 0.450–4.500)

## 2019-03-24 LAB — ANTI MULLERIAN HORMONE: ANTI-MULLERIAN HORMONE (AMH): 0.199 ng/mL

## 2019-03-24 LAB — VITAMIN D 25 HYDROXY (VIT D DEFICIENCY, FRACTURES): Vit D, 25-Hydroxy: 45.8 ng/mL (ref 30.0–100.0)

## 2019-04-26 ENCOUNTER — Ambulatory Visit
Admission: RE | Admit: 2019-04-26 | Discharge: 2019-04-26 | Disposition: A | Discharge status: Home / Self Care | Payer: No Typology Code available for payment source | Source: Ambulatory Visit | Attending: Obstetrics & Gynecology | Admitting: Obstetrics & Gynecology

## 2019-04-26 ENCOUNTER — Other Ambulatory Visit: Payer: Self-pay

## 2019-04-26 DIAGNOSIS — Z1231 Encounter for screening mammogram for malignant neoplasm of breast: Secondary | ICD-10-CM

## 2019-06-20 ENCOUNTER — Telehealth: Payer: Self-pay | Admitting: Obstetrics & Gynecology

## 2019-06-20 NOTE — Telephone Encounter (Signed)
Patient states she needs refill on diflucan. She has been off of her birth control for over a week due to the pharmacy being out of them and she believes this is the reason she is having symptoms. Pharmacy is [facility] [address]

## 2019-06-20 NOTE — Telephone Encounter (Signed)
Spoke with patient. Patient reports vaginal itching and irritation for the past 5 days. Denies any other GYN symptoms. Has been off of OCP for several days due to pharmacy not having med in stock.   Patient is requesting RX for diflucan, states she has been prescribed this in the past to "have on hand", Rx has expired. Patient states she does not want to use OTC monistat.   Patient reports no change in regimen or products, asking of being off OCP can be the cause?   Pharmacy on file confirmed.   Advised I will forward for Dr. Sabra Heck to review and f/u with recommendations, patient agreeable.   Dr. Sabra Heck -please advise.

## 2019-06-21 MED ORDER — FLUCONAZOLE 150 MG PO TABS: ORAL_TABLET | ORAL | 0 refills | Status: DC

## 2019-06-21 NOTE — Telephone Encounter (Signed)
Rx for diflucan sent to pharmacy on file.  I called pt personally but mailbox is full and I could not leave a message.  I don't think starting pills late would cause this so if symptoms do not resolve, she needs to call and have in person evaluation.  Thanks.

## 2019-06-22 NOTE — Telephone Encounter (Signed)
Call to patient, left detailed message, ok per dpr. Advised as seen below per Dr. Sabra Heck. Advised to return call to office if any additional questions.   Encounter closed.

## 2019-11-28 ENCOUNTER — Other Ambulatory Visit: Payer: Self-pay

## 2019-11-28 MED ORDER — TEMAZEPAM 15 MG PO CAPS: 15.0000 mg | ORAL_CAPSULE | Freq: Every evening | ORAL | 0 refills | Status: DC | PRN

## 2019-11-28 NOTE — Telephone Encounter (Signed)
Sent signed rx by Dr.Miller to [facility] in [place], [place]. Temazepam 15 MG #30, 0RF

## 2019-11-28 NOTE — Telephone Encounter (Signed)
Medication refill request: Tamezepam  Last AEX:  03/21/2019 Next AEX: 06/01/2020 with MM Last MMG (if hormonal medication request): n/a Refill authorized: #30, 0RF   Please advise and refill if appropriate.

## 2020-04-19 ENCOUNTER — Other Ambulatory Visit: Payer: Self-pay | Admitting: Obstetrics & Gynecology

## 2020-04-19 DIAGNOSIS — D250 Submucous leiomyoma of uterus: Secondary | ICD-10-CM

## 2020-04-19 NOTE — Telephone Encounter (Signed)
Medication refill request: Norethindrone 21 Last AEX:  03/21/19 Next AEX: 06/01/20 Last MMG (if hormonal medication request): NA Refill authorized: 84/0

## 2020-05-28 NOTE — Progress Notes (Signed)
49 y.o. G0P0000 Single White or Caucasian female here for annual exam.  Father passed in April.  Busy with work.    Cycles are regular between 23-28 days.  First two days are heavier and then flow is lighter.  Does not cramp.   Does not have any mid cycle spotting.  Flow is definitely heavier off OCPs but really pleased with not having the mid cycle spotting.  Has considered endometrial ablation in the past.  Does have hx of fibroids.  Continues to consider.  Tubal ligation recommended with procedure if she decides to proceed.  Is sure she does not want any children.  Together with significant other three years.    Patient's last menstrual period was 05/06/2020 (exact date).          Sexually active: Yes.    The current method of family planning is none.    Exercising: Yes.    cardio Smoker:  no  Health Maintenance: Pap:  09-11-17 neg History of abnormal Pap:  yes MMG:  04-26-2019 category c density birads 1:neg Colonoscopy:  Discussed guidelines with pt.  She declines for now.   BMD:   none TDaP:  2020 Pneumonia vaccine(s):  no Shingrix:   no Hep C testing: not done Screening Labs: not done today   reports that she has never smoked. She has never used smokeless tobacco. She reports previous alcohol use. She reports that she does not use drugs.  Past Medical History:  Diagnosis Date  . Anxiety   . Deviated nasal septum   . Fibroids, submucosal 04/14/2018   several fibroids measuring 8 x 39mm, 2.3 x 2.3cm, 8 x 49mm, and 10 x 44mm  . History of abnormal Pap smear 2/93   LEEP, CIN I  . History of spider veins   . Left cataract    History of   . Lipoma 03/24/2014   Left supraclavicular    Past Surgical History:  Procedure Laterality Date  . CATARACT EXTRACTION Left 2012  . DILATATION & CURETTAGE/HYSTEROSCOPY WITH MYOSURE N/A 05/03/2018   Procedure: DILATATION & CURETTAGE/HYSTEROSCOPY WITH MYOSURE;  Surgeon: Megan Salon, MD;  Location: [facility];  Service:  Gynecology;  Laterality: N/A;  Fibroid resection  . LAPAROSCOPIC OVARIAN CYSTECTOMY Left 05/03/2018   Procedure: LAPAROSCOPIC OVARIAN CYSTECTOMY  possible LSO;  Surgeon: Megan Salon, MD;  Location: [facility];  Service: Gynecology;  Laterality: Left;  Possible LSO  . LEEP  [place]  . Sclerotherapy  11/06/2015  . Septorhinoplasty  01/23/2005  . TONSILLECTOMY  11/14    Current Outpatient Medications  Medication Sig Dispense Refill  . Cholecalciferol (VITAMIN D PO) Take 1,000 Int'l Units by mouth.     . MELATONIN PO Take by mouth.    . Multiple Vitamin (MULTIVITAMIN) tablet Take 1 tablet by mouth daily.    Marland Kitchen UNABLE TO FIND Nutrafol hair vitamin    . UNABLE TO FIND Testosterone pellet    . valACYclovir (VALTREX) 1000 MG tablet Take 1 tablet (1,000 mg total) by mouth 2 (two) times daily. Take for 3 days 6 tablet 1   No current facility-administered medications for this visit.    Family History  Problem Relation Age of Onset  . Breast cancer Maternal Aunt   . Breast cancer Cousin        maternal  . Colon cancer Mother 36  . Colon cancer Maternal Grandmother   . Colon cancer Maternal Grandfather   .  Colon cancer Maternal Uncle   . Lung cancer Paternal Grandfather     Review of Systems  Constitutional: Negative.   HENT: Negative.   Eyes: Negative.   Respiratory: Negative.   Cardiovascular: Negative.   Gastrointestinal: Negative.   Endocrine: Negative.   Genitourinary: Negative.   Musculoskeletal: Negative.   Skin: Negative.   Allergic/Immunologic: Negative.   Neurological: Negative.   Hematological: Negative.   Psychiatric/Behavioral: Negative.     Exam:   BP 118/80   Pulse 70   Resp 16   Ht 5' 0.75" (1.543 m)   Wt 134 lb (60.8 kg)   LMP 05/06/2020 (Exact Date)   BMI 25.53 kg/m   Height: 5' 0.75" (154.3 cm)  General appearance: alert, cooperative and appears stated age Head: Normocephalic, without obvious abnormality,  atraumatic Neck: no adenopathy, supple, symmetrical, trachea midline and thyroid normal to inspection and palpation Lungs: clear to auscultation bilaterally Breasts: normal appearance, no masses or tenderness Heart: regular rate and rhythm Abdomen: soft, non-tender; bowel sounds normal; no masses,  no organomegaly Extremities: extremities normal, atraumatic, no cyanosis or edema Skin: Skin color, texture, turgor normal. No rashes or lesions Lymph nodes: Cervical, supraclavicular, and axillary nodes normal. No abnormal inguinal nodes palpated Neurologic: Grossly normal   Pelvic: External genitalia:  no lesions              Urethra:  normal appearing urethra with no masses, tenderness or lesions              Bartholins and Skenes: normal                 Vagina: normal appearing vagina with normal color and discharge, no lesions              Cervix: no lesions              Pap taken: Yes.   Bimanual Exam:  Uterus:  enlarged, 8 weeks size              Adnexa: no mass, fullness, tenderness               Rectovaginal: Confirms               Anus:  normal sphincter tone, no lesions  Chaperone, Terence Lux, CMA, was present for exam.  A:  Well Woman with normal exam H/o uterine fibroids H/o midcycle bleeding that resolved stopping OCPs H/o menorrhagia Recurrent yeast vaginitis with exercise H/o LEEP 1991 with CIN 1  P:   Mammogram guidelines reviewed.  Doing yearly pap smear with HR HPV obtained today Colonoscopy referral recommended for colon cancer screening.  Declines this year. Diflucan 150mg  po x 1, repeat q 72 hours x 2 doses.  #3/1RF RF for valtrex 1 gram bid x 3 days.  #6/2RF Lab work done with Dr. Inda Merlin. Return annually or prn

## 2020-06-01 ENCOUNTER — Other Ambulatory Visit (HOSPITAL_COMMUNITY)
Admission: RE | Admit: 2020-06-01 | Discharge: 2020-06-01 | Disposition: A | Discharge status: Home / Self Care | Payer: No Typology Code available for payment source | Source: Ambulatory Visit | Attending: Obstetrics & Gynecology | Admitting: Obstetrics & Gynecology

## 2020-06-01 ENCOUNTER — Ambulatory Visit (INDEPENDENT_AMBULATORY_CARE_PROVIDER_SITE_OTHER): Payer: 59 | Admitting: Obstetrics & Gynecology

## 2020-06-01 ENCOUNTER — Other Ambulatory Visit: Payer: Self-pay

## 2020-06-01 ENCOUNTER — Encounter: Payer: Self-pay | Admitting: Obstetrics & Gynecology

## 2020-06-01 VITALS — BP 118/80 | HR 70 | Resp 16 | Ht 60.75 in | Wt 134.0 lb

## 2020-06-01 DIAGNOSIS — Z124 Encounter for screening for malignant neoplasm of cervix: Secondary | ICD-10-CM | POA: Insufficient documentation

## 2020-06-01 DIAGNOSIS — Z01419 Encounter for gynecological examination (general) (routine) without abnormal findings: Secondary | ICD-10-CM | POA: Diagnosis not present

## 2020-06-01 MED ORDER — VALACYCLOVIR HCL 1 G PO TABS: 1000.0000 mg | ORAL_TABLET | Freq: Two times a day (BID) | ORAL | 2 refills | Status: DC

## 2020-06-01 MED ORDER — FLUCONAZOLE 150 MG PO TABS: ORAL_TABLET | ORAL | 1 refills | Status: DC

## 2020-06-05 LAB — CYTOLOGY - PAP
Comment: NEGATIVE
Diagnosis: NEGATIVE
High risk HPV: NEGATIVE

## 2020-07-18 IMAGING — MG MM DIGITAL SCREENING BILAT W/ TOMO W/ CAD
8 series · 9 of 24 positions shown · non-contrast
Comparison: Previous exam(s).

CLINICAL DATA: Screening.

EXAM:
DIGITAL SCREENING BILATERAL MAMMOGRAM WITH TOMO AND CAD

[R CC synth-2D]
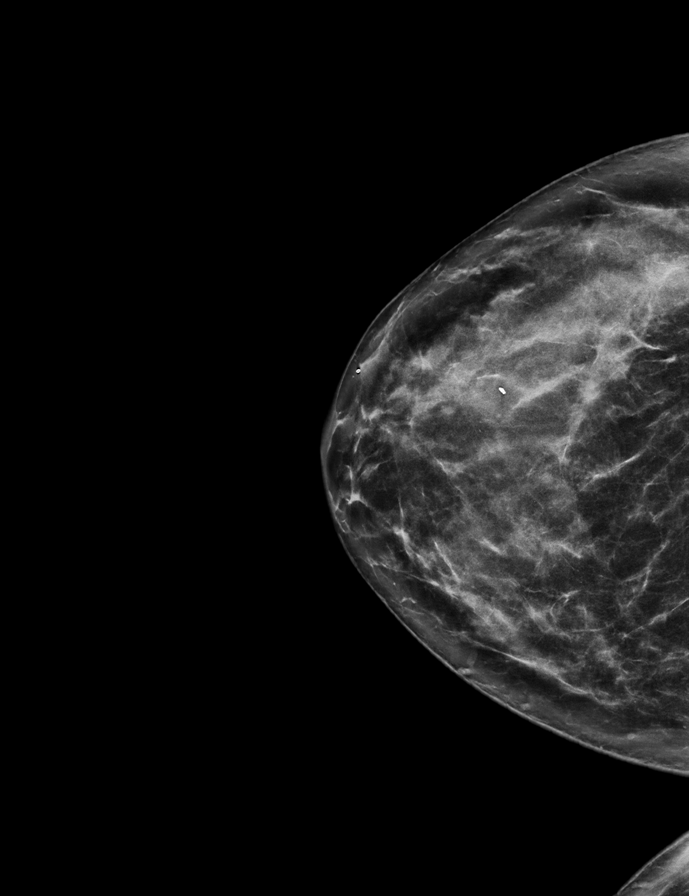

[L MLO synth-2D]
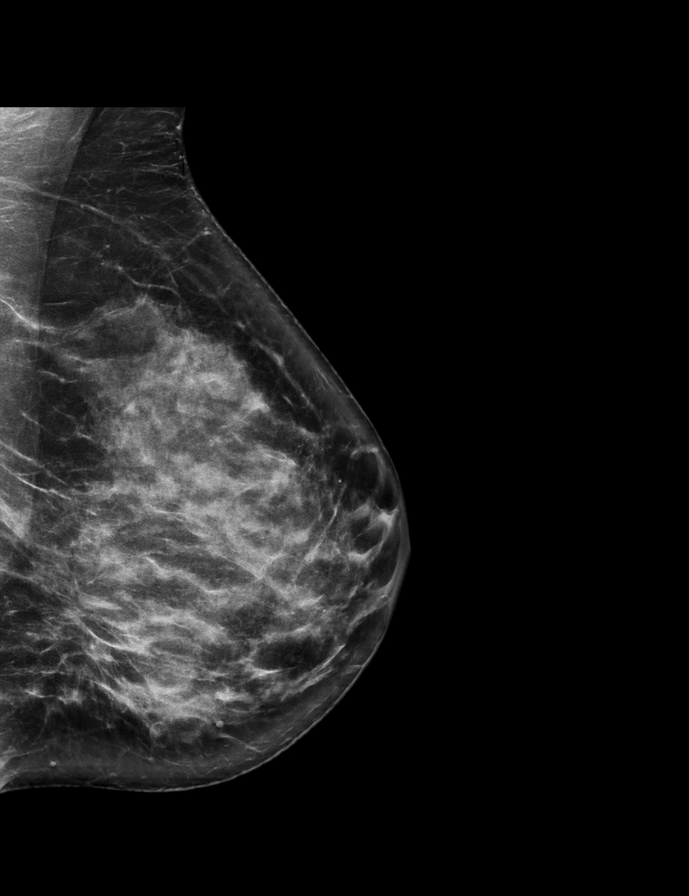

[R MLO synth-2D]
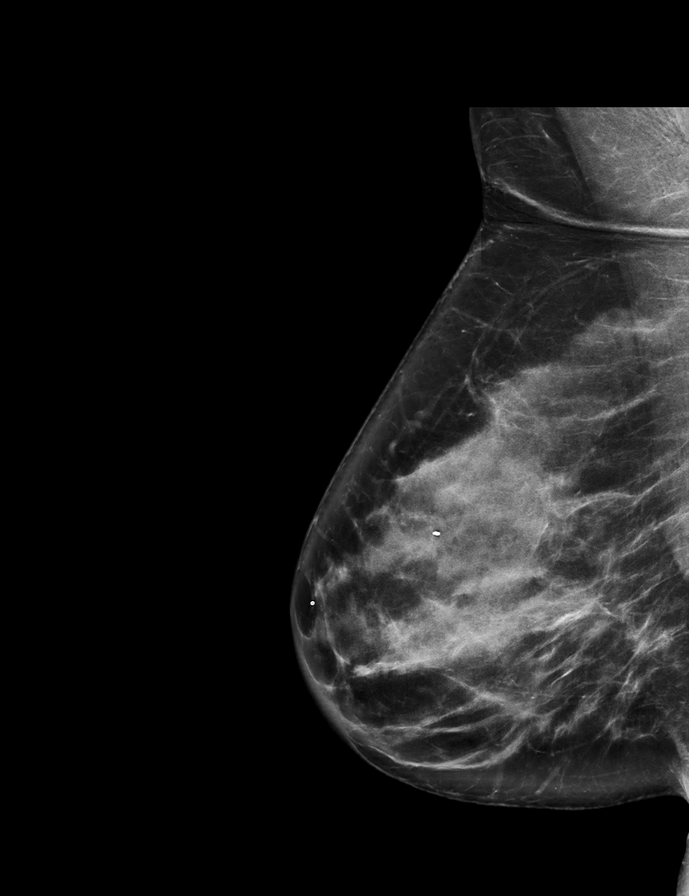

[L CC synth-2D]
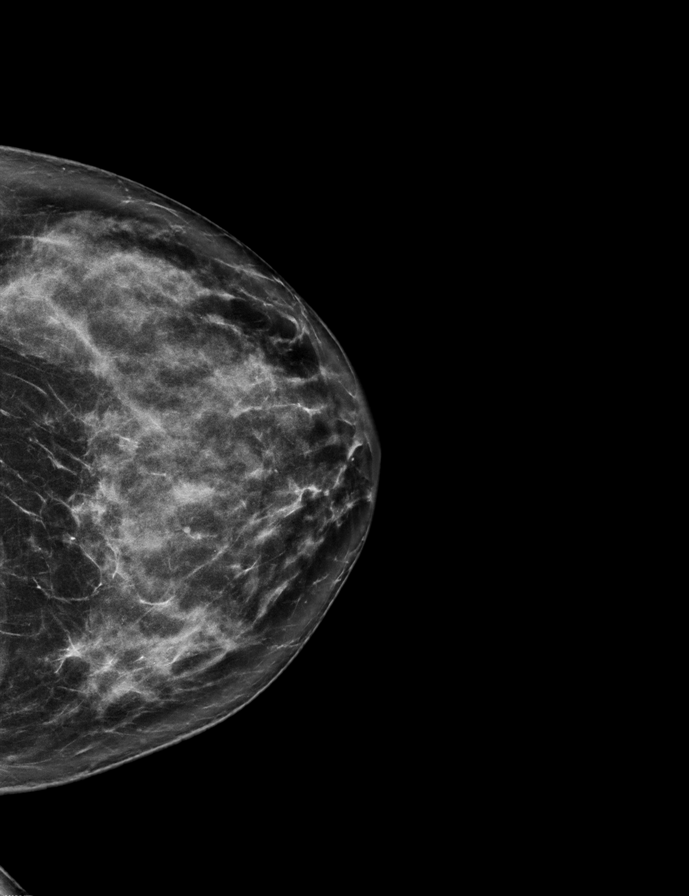

[R CC tomo · 2 of 66 frames shown]
[frame 22/66]
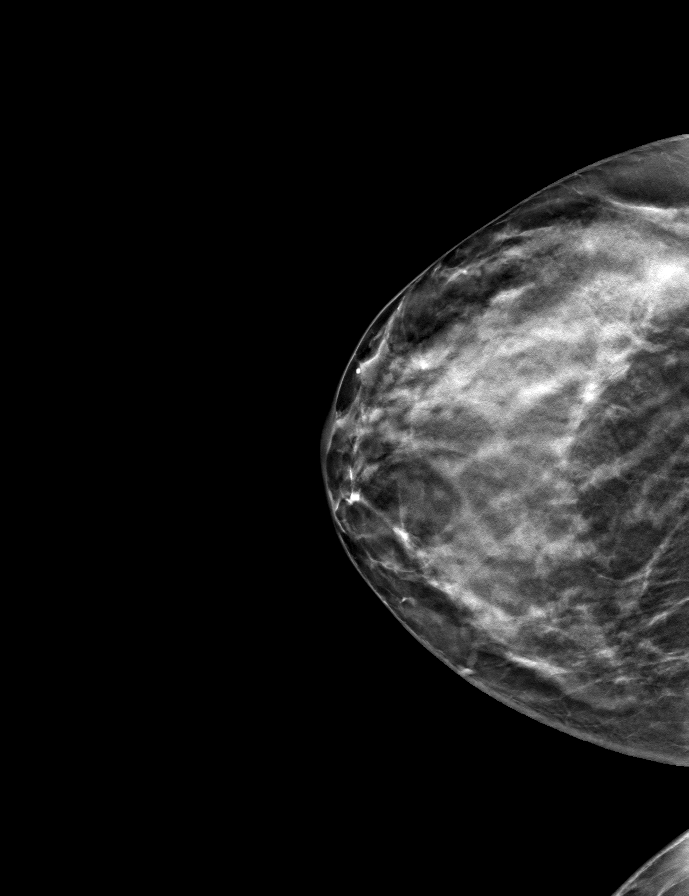
[frame 33/66]
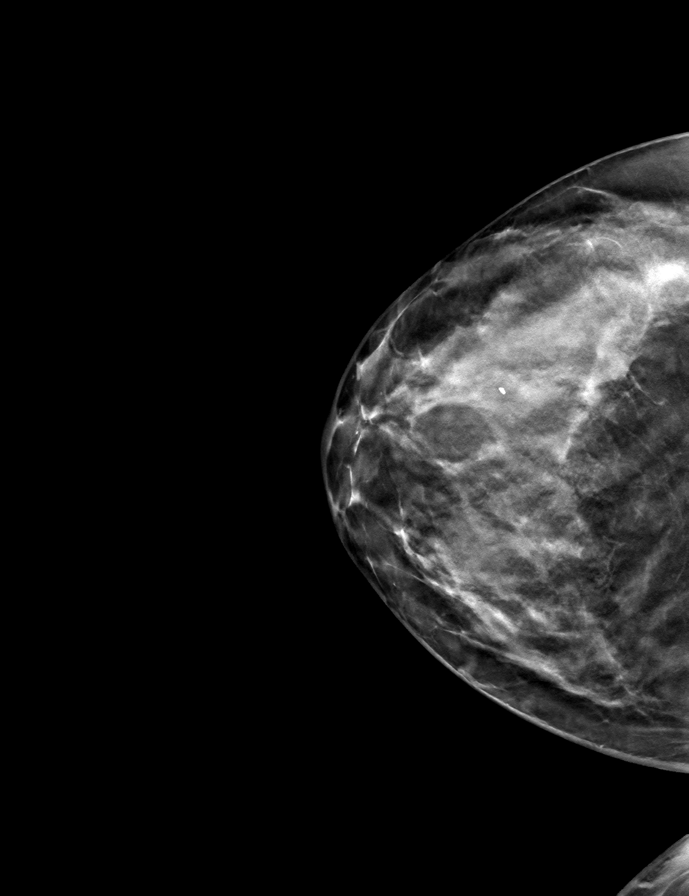

[L CC tomo · tomo slice 35/69.0]
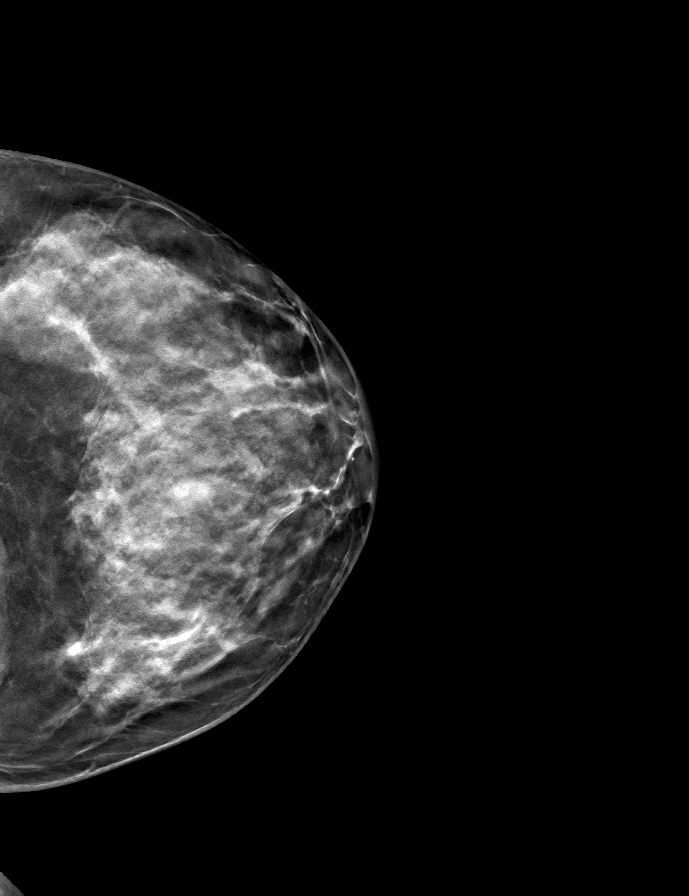

[L MLO tomo · tomo slice 37/72.0]
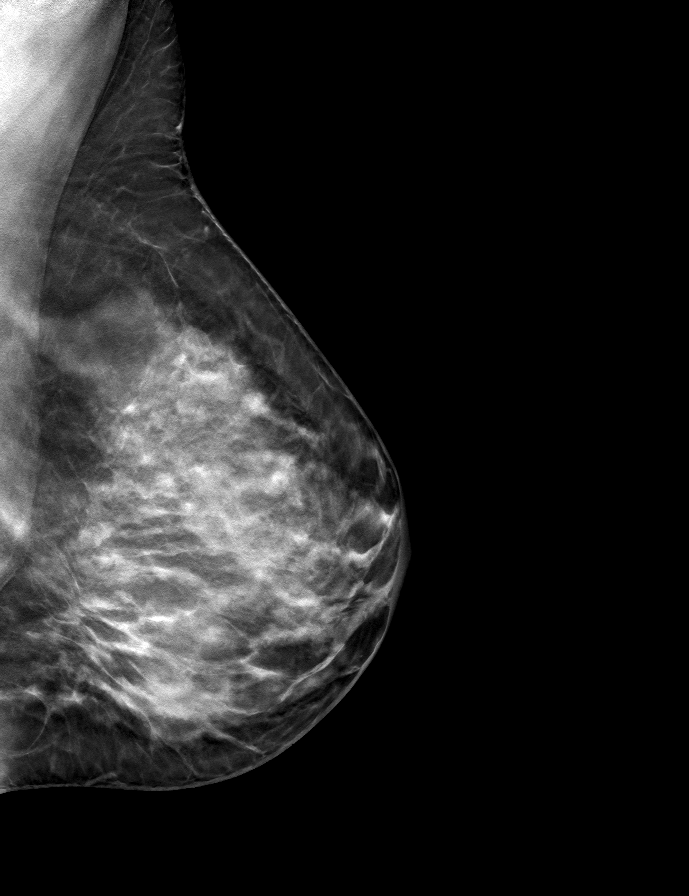

[R MLO tomo · tomo slice 39/76.0]
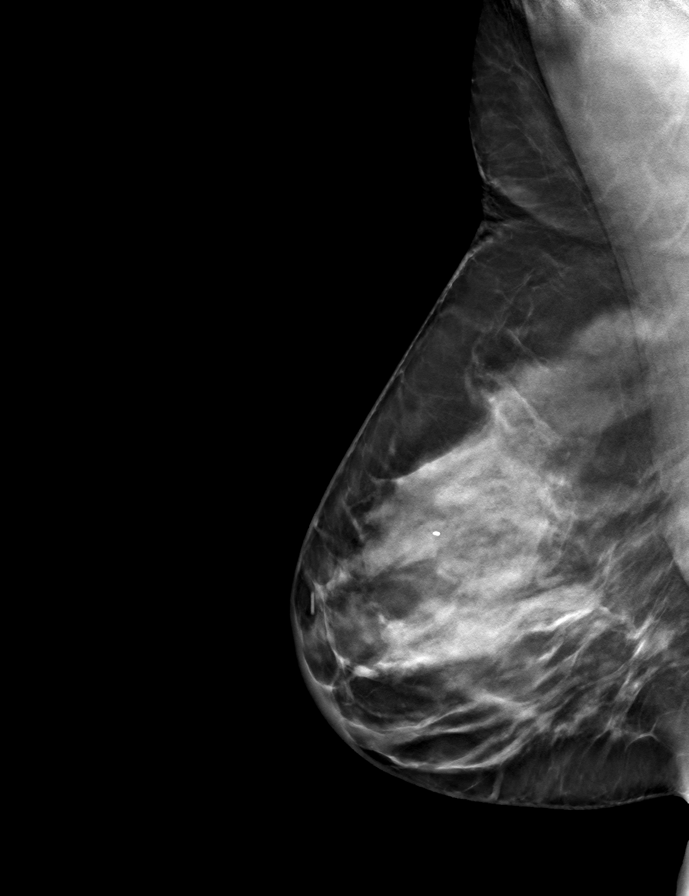

[9 of 24 positions shown; findings below may reference images not displayed]

ACR Breast Density Category c: The breast tissue is heterogeneously
dense, which may obscure small masses.
FINDINGS: There are no findings suspicious for malignancy. Images were
processed with CAD.
IMPRESSION: No mammographic evidence of malignancy. A result letter of this
screening mammogram will be mailed directly to the patient.

RECOMMENDATION:
Screening mammogram in one year. (Code:FT-U-LHB)

BI-RADS CATEGORY  1: Negative.

## 2020-12-11 ENCOUNTER — Telehealth (HOSPITAL_BASED_OUTPATIENT_CLINIC_OR_DEPARTMENT_OTHER): Payer: Self-pay | Admitting: *Deleted

## 2020-12-11 NOTE — Telephone Encounter (Signed)
Pt called requesting refill on microgestin. She states that she had stopped taking the medication last August but has resumed in the last 3 months and now needs a refill.  Advised that I would send her request to the provider and she could check with the pharmacy in the next 24 hours. Pt verbalized understanding.

## 2020-12-17 ENCOUNTER — Other Ambulatory Visit (HOSPITAL_BASED_OUTPATIENT_CLINIC_OR_DEPARTMENT_OTHER): Payer: Self-pay | Admitting: Obstetrics & Gynecology

## 2020-12-17 ENCOUNTER — Telehealth (HOSPITAL_BASED_OUTPATIENT_CLINIC_OR_DEPARTMENT_OTHER): Payer: Self-pay | Admitting: *Deleted

## 2020-12-17 MED ORDER — NORETHINDRONE 0.35 MG PO TABS: 1.0000 | ORAL_TABLET | Freq: Every day | ORAL | 1 refills | Status: DC

## 2020-12-17 NOTE — Telephone Encounter (Signed)
LMOVM for pt to return my call.  

## 2020-12-18 ENCOUNTER — Telehealth: Payer: Self-pay | Admitting: *Deleted

## 2020-12-18 NOTE — Telephone Encounter (Signed)
2nd attempt to call pt. LMOVM for pt to return my call

## 2020-12-18 NOTE — Telephone Encounter (Signed)
DOB verified. Advised pt that refill had been sent to pharmacy and informed pt, per Dr. Sabra Heck, that she is now doing the newer treatment for fibroids that doesn't involve cutting out the fibroids, just ablating them. Advised that theyhad talked some last year about the radio frequency ablation of fibroids and that she could do some research on [facility] if she wanted to. Pt wanted to clarify that she should take micronor and not microgestin as she used to take. Advised that I would send her request to provider and get back to pt with her recommendations. Pt verbalized understanding.

## 2020-12-19 ENCOUNTER — Other Ambulatory Visit (HOSPITAL_BASED_OUTPATIENT_CLINIC_OR_DEPARTMENT_OTHER): Payer: Self-pay | Admitting: Obstetrics & Gynecology

## 2020-12-19 MED ORDER — NORETHINDRONE ACET-ETHINYL EST 1-20 MG-MCG PO TABS: 1.0000 | ORAL_TABLET | Freq: Every day | ORAL | 1 refills | Status: DC

## 2020-12-19 NOTE — Telephone Encounter (Signed)
LMOVM for pt to return my call.  

## 2020-12-21 NOTE — Telephone Encounter (Signed)
LMOVM that Rx had been sent to pharmacy 

## 2021-06-04 ENCOUNTER — Ambulatory Visit (HOSPITAL_BASED_OUTPATIENT_CLINIC_OR_DEPARTMENT_OTHER): Payer: Self-pay | Admitting: Obstetrics & Gynecology

## 2021-06-19 ENCOUNTER — Other Ambulatory Visit: Payer: Self-pay

## 2021-06-19 ENCOUNTER — Encounter (HOSPITAL_BASED_OUTPATIENT_CLINIC_OR_DEPARTMENT_OTHER): Payer: Self-pay

## 2021-06-19 ENCOUNTER — Ambulatory Visit (INDEPENDENT_AMBULATORY_CARE_PROVIDER_SITE_OTHER): Payer: 59 | Admitting: Obstetrics & Gynecology

## 2021-06-19 ENCOUNTER — Encounter (HOSPITAL_BASED_OUTPATIENT_CLINIC_OR_DEPARTMENT_OTHER): Payer: Self-pay | Admitting: Obstetrics & Gynecology

## 2021-06-19 VITALS — BP 124/83 | HR 90 | Ht 60.0 in | Wt 128.2 lb

## 2021-06-19 DIAGNOSIS — Z01419 Encounter for gynecological examination (general) (routine) without abnormal findings: Secondary | ICD-10-CM

## 2021-06-19 DIAGNOSIS — Z8742 Personal history of other diseases of the female genital tract: Secondary | ICD-10-CM

## 2021-06-19 DIAGNOSIS — D251 Intramural leiomyoma of uterus: Secondary | ICD-10-CM

## 2021-06-19 DIAGNOSIS — Z9889 Other specified postprocedural states: Secondary | ICD-10-CM

## 2021-06-19 DIAGNOSIS — Z1211 Encounter for screening for malignant neoplasm of colon: Secondary | ICD-10-CM

## 2021-06-19 DIAGNOSIS — Z800 Family history of malignant neoplasm of digestive organs: Secondary | ICD-10-CM

## 2021-06-19 DIAGNOSIS — N760 Acute vaginitis: Secondary | ICD-10-CM

## 2021-06-19 MED ORDER — NORETHINDRONE 0.35 MG PO TABS: 1.0000 | ORAL_TABLET | Freq: Every day | ORAL | 3 refills | Status: DC

## 2021-06-19 MED ORDER — VALACYCLOVIR HCL 1 G PO TABS: 1000.0000 mg | ORAL_TABLET | Freq: Two times a day (BID) | ORAL | 2 refills | Status: DC

## 2021-06-19 MED ORDER — FLUCONAZOLE 150 MG PO TABS: ORAL_TABLET | ORAL | 1 refills | Status: DC

## 2021-06-19 NOTE — Progress Notes (Signed)
50 y.o. G0P0000 Single White or Caucasian female here for annual exam.  Doing well.  Just ended current relationship.    Switched to norethindrone this past year and does not having any bleeding.  Really please with this.  Denies hot flashes.    Did have a panic attach this year.  Saw PCP after this happened.  Is now seeing a therapist.    Reports she received testosterone pellets this year but had a lot of hair loss.  She stopped this about six months ago.  On a supplement with biotin now.  Has been seen by dermatologist in [facility].  The supplement was suggested by this provider.    Has started doing strength training and having some more issues with yeast vaginitis.    Sexually active: Not currently  The current method of family planning is oral progesterone-only contraceptive.    Exercising: Yes.    Smoker:  no  Health Maintenance: Pap:  06/01/2020 Negative History of abnormal Pap:  yes MMG:  04/26/2019 Negative.  She knows this is overdue.   Colonoscopy:  referral placed Screening Labs: done earlier this year with Dr. Inda Merlin   reports that she has never smoked. She has never used smokeless tobacco. She reports that she does not currently use alcohol. She reports that she does not use drugs.  Past Medical History:  Diagnosis Date   Anxiety    Deviated nasal septum    Fibroids, submucosal 04/14/2018   several fibroids measuring 8 x 35mm, 2.3 x 2.3cm, 8 x 26mm, and 10 x 47mm   History of abnormal Pap smear 2/93   LEEP, CIN I   History of spider veins    Left cataract    History of    Lipoma 03/24/2014   Left supraclavicular    Past Surgical History:  Procedure Laterality Date   CATARACT EXTRACTION Left 2012   [place] N/A 05/03/2018   Procedure: [place];  Surgeon: Megan Salon, MD;  Location: [facility];  Service: Gynecology;  Laterality: N/A;  Fibroid resection    LAPAROSCOPIC OVARIAN CYSTECTOMY Left 05/03/2018   Procedure: LAPAROSCOPIC OVARIAN CYSTECTOMY  possible LSO;  Surgeon: Megan Salon, MD;  [facility];  Service: Gynecology;  Laterality: Left;  Possible LSO   LEEP  1993   CIN I   RHINOPLASTY  1997   Sclerotherapy  11/06/2015   Septorhinoplasty  01/23/2005   TONSILLECTOMY  11/14    Current Outpatient Medications  Medication Sig Dispense Refill   fluconazole (DIFLUCAN) 150 MG tablet 1 tab po q 72 hours x 3 doses 3 tablet 1   MELATONIN PO Take by mouth.     Multiple Vitamin (MULTIVITAMIN) tablet Take 1 tablet by mouth daily.     UNABLE TO FIND Nutrafol hair vitamin     Cholecalciferol (VITAMIN D PO) Take 1,000 Int'l Units by mouth.  (Patient not taking: Reported on 06/19/2021)     norethindrone (MICRONOR) 0.35 MG tablet Take 1 tablet (0.35 mg total) by mouth daily. 84 tablet 3   norethindrone-ethinyl estradiol (MICROGESTIN) 1-20 MG-MCG tablet Take 1 tablet by mouth daily. (Patient not taking: Reported on 06/19/2021) 84 tablet 1   phentermine 15 MG capsule Take 15 mg by mouth daily. (Patient not taking: Reported on 06/19/2021)     UNABLE TO FIND Testosterone pellet (Patient not taking: Reported on 06/19/2021)     valACYclovir (VALTREX) 1000 MG tablet Take 1 tablet (1,000  mg total) by mouth 2 (two) times daily. Take for 3 days 6 tablet 2   No current facility-administered medications for this visit.    Family History  Problem Relation Age of Onset   Breast cancer Maternal Aunt    Breast cancer Cousin        maternal   Colon cancer Mother 69   Colon cancer Maternal Grandmother    Colon cancer Maternal Grandfather    Colon cancer Maternal Uncle    Lung cancer Paternal Grandfather     Review of Systems  All other systems reviewed and are negative.  Exam:   BP 124/83 (BP Location: Right Arm, Patient Position: Sitting, Cuff Size: Normal)   Pulse 90   Ht 5' (1.524 m)   Wt 128 lb 3.2 oz (58.2 kg)   BMI 25.04  kg/m   Height: 5' (152.4 cm)  General appearance: alert, cooperative and appears stated age Head: Normocephalic, without obvious abnormality, atraumatic Neck: no adenopathy, supple, symmetrical, trachea midline and thyroid normal to inspection and palpation Lungs: clear to auscultation bilaterally Breasts: normal appearance, no masses or tenderness Heart: regular rate and rhythm Abdomen: soft, non-tender; bowel sounds normal; no masses,  no organomegaly Extremities: extremities normal, atraumatic, no cyanosis or edema Skin: Skin color, texture, turgor normal. No rashes or lesions Lymph nodes: Cervical, supraclavicular, and axillary nodes normal. No abnormal inguinal nodes palpated Neurologic: Grossly normal   Pelvic: External genitalia:  no lesions              Urethra:  normal appearing urethra with no masses, tenderness or lesions              Bartholins and Skenes: normal                 Vagina: normal appearing vagina with normal color and no discharge, no lesions              Cervix: no lesions              Pap taken: No. Bimanual Exam:  Uterus:  enlarged, 8-10 weeks size              Adnexa: normal adnexa               Rectovaginal: Confirms               Anus:  normal sphincter tone, no lesions  Chaperone, Octaviano Batty, CMA, was present for exam.  Assessment/Plan: 1. Well woman exam with routine gynecological exam - pap neg with neg HR HPV 2021.  Not indicated today. - MMG 04/26/2019.  Pt aware this is overdue. - colonoscopy referral placed - lab work done with Dr. Inda Merlin earlier this year - care gaps reviewed/updated  2. Family history of colon cancer - Ambulatory referral to Gastroenterology  3. Intramural leiomyoma of uterus  4. History of menorrhagia - norethindrone (MICRONOR) 0.35 MG tablet; Take 1 tablet (0.35 mg total) by mouth daily.  Dispense: 84 tablet; Refill: 3  5. Recurrent vaginitis with exercise - fluconazole (DIFLUCAN) 150 MG tablet; 1 tab po q 72  hours x 3 doses  Dispense: 3 tablet; Refill: 1  6.  H/o HSV - RF for valacyclovir 1000mg  po bid x 3 days with symptoms.  Rx to pharmacy  7. H/O LEEP - 1991

## 2021-06-22 DIAGNOSIS — D251 Intramural leiomyoma of uterus: Secondary | ICD-10-CM | POA: Insufficient documentation

## 2021-06-22 DIAGNOSIS — Z9889 Other specified postprocedural states: Secondary | ICD-10-CM | POA: Insufficient documentation

## 2021-06-22 DIAGNOSIS — N760 Acute vaginitis: Secondary | ICD-10-CM | POA: Insufficient documentation

## 2021-07-17 HISTORY — PX: ABDOMINOPLASTY: SUR9

## 2021-11-06 DIAGNOSIS — M9902 Segmental and somatic dysfunction of thoracic region: Secondary | ICD-10-CM | POA: Diagnosis not present

## 2021-11-06 DIAGNOSIS — M9901 Segmental and somatic dysfunction of cervical region: Secondary | ICD-10-CM | POA: Diagnosis not present

## 2021-11-06 DIAGNOSIS — M5384 Other specified dorsopathies, thoracic region: Secondary | ICD-10-CM | POA: Diagnosis not present

## 2021-11-06 DIAGNOSIS — M50323 Other cervical disc degeneration at C6-C7 level: Secondary | ICD-10-CM | POA: Diagnosis not present

## 2021-11-12 DIAGNOSIS — M5384 Other specified dorsopathies, thoracic region: Secondary | ICD-10-CM | POA: Diagnosis not present

## 2021-11-12 DIAGNOSIS — M9901 Segmental and somatic dysfunction of cervical region: Secondary | ICD-10-CM | POA: Diagnosis not present

## 2021-11-12 DIAGNOSIS — M9902 Segmental and somatic dysfunction of thoracic region: Secondary | ICD-10-CM | POA: Diagnosis not present

## 2021-11-12 DIAGNOSIS — M50323 Other cervical disc degeneration at C6-C7 level: Secondary | ICD-10-CM | POA: Diagnosis not present

## 2021-11-19 DIAGNOSIS — M9901 Segmental and somatic dysfunction of cervical region: Secondary | ICD-10-CM | POA: Diagnosis not present

## 2021-11-19 DIAGNOSIS — M5384 Other specified dorsopathies, thoracic region: Secondary | ICD-10-CM | POA: Diagnosis not present

## 2021-11-19 DIAGNOSIS — M9902 Segmental and somatic dysfunction of thoracic region: Secondary | ICD-10-CM | POA: Diagnosis not present

## 2021-11-19 DIAGNOSIS — M50323 Other cervical disc degeneration at C6-C7 level: Secondary | ICD-10-CM | POA: Diagnosis not present

## 2021-11-26 DIAGNOSIS — M5384 Other specified dorsopathies, thoracic region: Secondary | ICD-10-CM | POA: Diagnosis not present

## 2021-11-26 DIAGNOSIS — M50323 Other cervical disc degeneration at C6-C7 level: Secondary | ICD-10-CM | POA: Diagnosis not present

## 2021-11-26 DIAGNOSIS — M9901 Segmental and somatic dysfunction of cervical region: Secondary | ICD-10-CM | POA: Diagnosis not present

## 2021-11-26 DIAGNOSIS — M9902 Segmental and somatic dysfunction of thoracic region: Secondary | ICD-10-CM | POA: Diagnosis not present

## 2021-12-04 DIAGNOSIS — M50323 Other cervical disc degeneration at C6-C7 level: Secondary | ICD-10-CM | POA: Diagnosis not present

## 2021-12-04 DIAGNOSIS — M5384 Other specified dorsopathies, thoracic region: Secondary | ICD-10-CM | POA: Diagnosis not present

## 2021-12-04 DIAGNOSIS — M9901 Segmental and somatic dysfunction of cervical region: Secondary | ICD-10-CM | POA: Diagnosis not present

## 2021-12-04 DIAGNOSIS — M9902 Segmental and somatic dysfunction of thoracic region: Secondary | ICD-10-CM | POA: Diagnosis not present

## 2021-12-06 ENCOUNTER — Telehealth (HOSPITAL_BASED_OUTPATIENT_CLINIC_OR_DEPARTMENT_OTHER): Payer: Self-pay

## 2021-12-06 NOTE — Telephone Encounter (Signed)
Patient is calling with concerns about birth control. Patient states that she is currently taking Progesterone. Darlina Sicilian) It has worked very well for her with very little symptoms. Recently she has been given a generic brand. Since starting the generic brand patient states that she has experienced a lot of spotting. Right now she is currently on a full blown menstrual cycle. She would like for you to give her a call.  ?

## 2021-12-07 ENCOUNTER — Other Ambulatory Visit (HOSPITAL_BASED_OUTPATIENT_CLINIC_OR_DEPARTMENT_OTHER): Payer: Self-pay | Admitting: Obstetrics & Gynecology

## 2021-12-07 MED ORDER — NORLYDA 0.35 MG PO TABS: 1.0000 | ORAL_TABLET | Freq: Every day | ORAL | 2 refills | Status: DC

## 2021-12-09 NOTE — Telephone Encounter (Signed)
Called and spoke with patient to let her know that a script for "name brand only" has been sent into the pharmacy for her. Patient states she will check with the pharmacy to see if they have the name brand. That was the issue before, they just did not have the name brand. She will call us if she needs anything else. ?

## 2021-12-10 DIAGNOSIS — M9902 Segmental and somatic dysfunction of thoracic region: Secondary | ICD-10-CM | POA: Diagnosis not present

## 2021-12-10 DIAGNOSIS — M50323 Other cervical disc degeneration at C6-C7 level: Secondary | ICD-10-CM | POA: Diagnosis not present

## 2021-12-10 DIAGNOSIS — M5384 Other specified dorsopathies, thoracic region: Secondary | ICD-10-CM | POA: Diagnosis not present

## 2021-12-10 DIAGNOSIS — M9901 Segmental and somatic dysfunction of cervical region: Secondary | ICD-10-CM | POA: Diagnosis not present

## 2021-12-26 DIAGNOSIS — M9902 Segmental and somatic dysfunction of thoracic region: Secondary | ICD-10-CM | POA: Diagnosis not present

## 2021-12-26 DIAGNOSIS — M50323 Other cervical disc degeneration at C6-C7 level: Secondary | ICD-10-CM | POA: Diagnosis not present

## 2021-12-26 DIAGNOSIS — M9901 Segmental and somatic dysfunction of cervical region: Secondary | ICD-10-CM | POA: Diagnosis not present

## 2021-12-26 DIAGNOSIS — M5384 Other specified dorsopathies, thoracic region: Secondary | ICD-10-CM | POA: Diagnosis not present

## 2022-01-02 DIAGNOSIS — M5384 Other specified dorsopathies, thoracic region: Secondary | ICD-10-CM | POA: Diagnosis not present

## 2022-01-02 DIAGNOSIS — M50323 Other cervical disc degeneration at C6-C7 level: Secondary | ICD-10-CM | POA: Diagnosis not present

## 2022-01-02 DIAGNOSIS — M9901 Segmental and somatic dysfunction of cervical region: Secondary | ICD-10-CM | POA: Diagnosis not present

## 2022-01-02 DIAGNOSIS — M9902 Segmental and somatic dysfunction of thoracic region: Secondary | ICD-10-CM | POA: Diagnosis not present

## 2022-01-07 DIAGNOSIS — M9901 Segmental and somatic dysfunction of cervical region: Secondary | ICD-10-CM | POA: Diagnosis not present

## 2022-01-07 DIAGNOSIS — M5384 Other specified dorsopathies, thoracic region: Secondary | ICD-10-CM | POA: Diagnosis not present

## 2022-01-07 DIAGNOSIS — M9902 Segmental and somatic dysfunction of thoracic region: Secondary | ICD-10-CM | POA: Diagnosis not present

## 2022-01-07 DIAGNOSIS — M50323 Other cervical disc degeneration at C6-C7 level: Secondary | ICD-10-CM | POA: Diagnosis not present

## 2022-01-14 DIAGNOSIS — M50323 Other cervical disc degeneration at C6-C7 level: Secondary | ICD-10-CM | POA: Diagnosis not present

## 2022-01-14 DIAGNOSIS — M5384 Other specified dorsopathies, thoracic region: Secondary | ICD-10-CM | POA: Diagnosis not present

## 2022-01-14 DIAGNOSIS — M9901 Segmental and somatic dysfunction of cervical region: Secondary | ICD-10-CM | POA: Diagnosis not present

## 2022-01-14 DIAGNOSIS — M9902 Segmental and somatic dysfunction of thoracic region: Secondary | ICD-10-CM | POA: Diagnosis not present

## 2022-01-16 DIAGNOSIS — Z13220 Encounter for screening for lipoid disorders: Secondary | ICD-10-CM | POA: Diagnosis not present

## 2022-01-16 DIAGNOSIS — R635 Abnormal weight gain: Secondary | ICD-10-CM | POA: Diagnosis not present

## 2022-04-17 ENCOUNTER — Other Ambulatory Visit (HOSPITAL_BASED_OUTPATIENT_CLINIC_OR_DEPARTMENT_OTHER): Payer: Self-pay | Admitting: Obstetrics & Gynecology

## 2022-04-17 DIAGNOSIS — N760 Acute vaginitis: Secondary | ICD-10-CM

## 2022-04-17 MED ORDER — FLUCONAZOLE 150 MG PO TABS: ORAL_TABLET | ORAL | 0 refills | Status: DC

## 2022-04-24 ENCOUNTER — Other Ambulatory Visit (HOSPITAL_COMMUNITY)
Admission: RE | Admit: 2022-04-24 | Discharge: 2022-04-24 | Disposition: A | Discharge status: Home / Self Care | Payer: 59 | Source: Ambulatory Visit | Attending: Obstetrics & Gynecology | Admitting: Obstetrics & Gynecology

## 2022-04-24 ENCOUNTER — Ambulatory Visit (INDEPENDENT_AMBULATORY_CARE_PROVIDER_SITE_OTHER): Payer: 59 | Admitting: *Deleted

## 2022-04-24 ENCOUNTER — Other Ambulatory Visit: Payer: Self-pay | Admitting: Obstetrics & Gynecology

## 2022-04-24 DIAGNOSIS — N898 Other specified noninflammatory disorders of vagina: Secondary | ICD-10-CM | POA: Insufficient documentation

## 2022-04-24 DIAGNOSIS — Z1231 Encounter for screening mammogram for malignant neoplasm of breast: Secondary | ICD-10-CM

## 2022-04-24 NOTE — Progress Notes (Signed)
Pt presents to office with vaginal discharge that did not go away after completion of diflucan. Pt instructed on performing self swab. Swab obtained and sent for testing. Advised pt that we would notify her of results. Pt verbalized understanding.

## 2022-04-25 LAB — CERVICOVAGINAL ANCILLARY ONLY
Bacterial Vaginitis (gardnerella): NEGATIVE
Candida Glabrata: NEGATIVE
Candida Vaginitis: POSITIVE — AB
Chlamydia: NEGATIVE
Comment: NEGATIVE
Comment: NEGATIVE
Comment: NEGATIVE
Comment: NEGATIVE
Comment: NEGATIVE
Comment: NORMAL
Neisseria Gonorrhea: NEGATIVE
Trichomonas: NEGATIVE

## 2022-04-28 ENCOUNTER — Other Ambulatory Visit (HOSPITAL_BASED_OUTPATIENT_CLINIC_OR_DEPARTMENT_OTHER): Payer: Self-pay | Admitting: *Deleted

## 2022-04-28 MED ORDER — FLUCONAZOLE 150 MG PO TABS: 150.0000 mg | ORAL_TABLET | ORAL | 0 refills | Status: DC

## 2022-04-28 MED ORDER — FLUCONAZOLE 150 MG PO TABS: 150.0000 mg | ORAL_TABLET | Freq: Once | ORAL | 0 refills | Status: DC

## 2022-04-28 NOTE — Progress Notes (Signed)
Rx sent to pharmacy for treatment of yeast  

## 2022-04-28 NOTE — Addendum Note (Signed)
Addended by: Blenda Nicely on: 04/28/2022 11:43 AM   Modules accepted: Orders

## 2022-05-01 ENCOUNTER — Other Ambulatory Visit (HOSPITAL_BASED_OUTPATIENT_CLINIC_OR_DEPARTMENT_OTHER): Payer: Self-pay | Admitting: Obstetrics & Gynecology

## 2022-05-01 MED ORDER — TERCONAZOLE 0.4 % VA CREA: 1.0000 | TOPICAL_CREAM | Freq: Every day | VAGINAL | 0 refills | Status: DC

## 2022-05-01 NOTE — Progress Notes (Signed)
Rx for terazol sent to pharmacy

## 2022-05-09 ENCOUNTER — Ambulatory Visit
Admission: RE | Admit: 2022-05-09 | Discharge: 2022-05-09 | Disposition: A | Discharge status: Home / Self Care | Payer: 59 | Source: Ambulatory Visit | Attending: Obstetrics & Gynecology | Admitting: Obstetrics & Gynecology

## 2022-05-09 DIAGNOSIS — Z1231 Encounter for screening mammogram for malignant neoplasm of breast: Secondary | ICD-10-CM

## 2022-05-13 ENCOUNTER — Other Ambulatory Visit: Payer: Self-pay | Admitting: Obstetrics & Gynecology

## 2022-05-13 DIAGNOSIS — R928 Other abnormal and inconclusive findings on diagnostic imaging of breast: Secondary | ICD-10-CM

## 2022-05-26 ENCOUNTER — Other Ambulatory Visit: Payer: 59

## 2022-05-30 ENCOUNTER — Ambulatory Visit
Admission: RE | Admit: 2022-05-30 | Discharge: 2022-05-30 | Disposition: A | Discharge status: Home / Self Care | Payer: 59 | Source: Ambulatory Visit | Attending: Obstetrics & Gynecology | Admitting: Obstetrics & Gynecology

## 2022-05-30 DIAGNOSIS — R928 Other abnormal and inconclusive findings on diagnostic imaging of breast: Secondary | ICD-10-CM

## 2022-06-09 ENCOUNTER — Encounter: Payer: Self-pay | Admitting: *Deleted

## 2022-06-16 ENCOUNTER — Other Ambulatory Visit (HOSPITAL_BASED_OUTPATIENT_CLINIC_OR_DEPARTMENT_OTHER): Payer: Self-pay | Admitting: Obstetrics & Gynecology

## 2022-06-16 MED ORDER — FLUCONAZOLE 150 MG PO TABS: 150.0000 mg | ORAL_TABLET | ORAL | 0 refills | Status: DC

## 2022-06-27 ENCOUNTER — Other Ambulatory Visit (HOSPITAL_BASED_OUTPATIENT_CLINIC_OR_DEPARTMENT_OTHER): Payer: Self-pay

## 2022-06-27 MED ORDER — NORLYDA 0.35 MG PO TABS: 1.0000 | ORAL_TABLET | Freq: Every day | ORAL | 0 refills | Status: DC

## 2022-07-04 ENCOUNTER — Ambulatory Visit (INDEPENDENT_AMBULATORY_CARE_PROVIDER_SITE_OTHER): Payer: 59 | Admitting: Obstetrics & Gynecology

## 2022-07-04 ENCOUNTER — Encounter (HOSPITAL_BASED_OUTPATIENT_CLINIC_OR_DEPARTMENT_OTHER): Payer: Self-pay | Admitting: Obstetrics & Gynecology

## 2022-07-04 VITALS — BP 148/78 | HR 70 | Ht 61.0 in | Wt 133.0 lb

## 2022-07-04 DIAGNOSIS — Z3041 Encounter for surveillance of contraceptive pills: Secondary | ICD-10-CM | POA: Diagnosis not present

## 2022-07-04 DIAGNOSIS — N938 Other specified abnormal uterine and vaginal bleeding: Secondary | ICD-10-CM

## 2022-07-04 DIAGNOSIS — Z800 Family history of malignant neoplasm of digestive organs: Secondary | ICD-10-CM | POA: Diagnosis not present

## 2022-07-04 DIAGNOSIS — B009 Herpesviral infection, unspecified: Secondary | ICD-10-CM | POA: Diagnosis not present

## 2022-07-04 DIAGNOSIS — Z8742 Personal history of other diseases of the female genital tract: Secondary | ICD-10-CM | POA: Diagnosis not present

## 2022-07-04 DIAGNOSIS — Z9889 Other specified postprocedural states: Secondary | ICD-10-CM | POA: Diagnosis not present

## 2022-07-04 DIAGNOSIS — Z01419 Encounter for gynecological examination (general) (routine) without abnormal findings: Secondary | ICD-10-CM | POA: Diagnosis not present

## 2022-07-04 DIAGNOSIS — N760 Acute vaginitis: Secondary | ICD-10-CM

## 2022-07-04 MED ORDER — VALACYCLOVIR HCL 1 G PO TABS: 1000.0000 mg | ORAL_TABLET | Freq: Two times a day (BID) | ORAL | 2 refills | Status: DC

## 2022-07-04 MED ORDER — SLYND 4 MG PO TABS: 1.0000 | ORAL_TABLET | Freq: Every day | ORAL | 3 refills | Status: DC

## 2022-07-04 NOTE — Progress Notes (Unsigned)
51 y.o. G0P0000 Single White or Caucasian female here for annual exam.  Cycles are all over the place.  She has done well in the past with POPs but the generic keeps getting changes and there is irregular bleeding.  H/o fibroids.  Has done better when can take branded medications.  Will try Slynd.  Does need contraception as well.  Keeps having yeast vaginitis.  Is worse with irregular bleeding.  Prevention strategies discussed including boric acid once or twice weekly for prevention.  Pt has not been tested in office much and is self treating so recommended coming for testing for documentation purposed as recurrent yeast treatment with weekly diflucan also discussed but feel we need to have documentation.  Pt voices understanding.  Does need valtrex refill.  Seeing new person.  Newly SA.   Sexually active: Yes.    The current method of family planning is POPs.    Exercising: Yes.     Smoker:  no  Health Maintenance: Pap:  06/01/2020 Negative History of abnormal Pap:  yes, h/o CIN 1 MMG:  05/09/2022 Needed additional imaging Colonoscopy:  declines until age 79 Screening Labs: Done with Dr. Inda Merlin.  He has retired.     reports that she has never smoked. She has never used smokeless tobacco. She reports that she does not currently use alcohol. She reports that she does not use drugs.  Past Medical History:  Diagnosis Date   Anxiety    Deviated nasal septum    Fibroids, submucosal 04/14/2018   several fibroids measuring 8 x 9m, 2.3 x 2.3cm, 8 x 64m and 10 x 66m10m History of abnormal Pap smear 2/93   LEEP, CIN I   History of spider veins    Left cataract    History of    Lipoma 03/24/2014   Left supraclavicular    Past Surgical History:  Procedure Laterality Date   ABDOMINOPLASTY  07/17/2021   in [place]   CATARACT EXTRACTION Left 2012   [place]IL[place] 05/03/2018   Procedure: [place]Surgeon:  MilMegan SalonD;  [facility];  Laterality: N/A;  Fibroid resection   LAPAROSCOPIC OVARIAN CYSTECTOMY Left 05/03/2018   Procedure: LAPAROSCOPIC OVARIAN CYSTECTOMY  possible LSO;  Surgeon: MilMegan SalonD;  [facility]  Laterality: Left;  Possible LSO   LEEP  1993   CIN I   RHINOPLASTY  1997   Sclerotherapy  11/06/2015   Septorhinoplasty  01/23/2005   TONSILLECTOMY  06/2013    Current Outpatient Medications  Medication Sig Dispense Refill   Drospirenone (SLYND) 4 MG TABS Take 1 tablet by mouth daily in the afternoon. 28 tablet 3   fluconazole (DIFLUCAN) 150 MG tablet Take 1 tablet (150 mg total) by mouth every 3 (three) days. Take one by mouth every 72 hours for 3 doses 3 tablet 0   MELATONIN PO Take by mouth.     Multiple Vitamin (MULTIVITAMIN) tablet Take 1 tablet by mouth daily.     UNABLE TO FIND Nutrafol hair vitamin     valACYclovir (VALTREX) 1000 MG tablet Take 1 tablet (1,000 mg total) by mouth 2 (two) times daily. Take for 3 days 6 tablet 2   No current facility-administered medications for this visit.    Family History  Problem Relation Age of Onset   Breast cancer Maternal Aunt    Breast cancer Cousin  maternal   Colon cancer Mother 39   Colon cancer Maternal Grandmother    Colon cancer Maternal Grandfather    Colon cancer Maternal Uncle    Lung cancer Paternal Grandfather     ROS: Constitutional: negative Genitourinary: irregular bleeding  Exam:   BP (!) 148/78 (BP Location: Left Arm, Patient Position: Sitting, Cuff Size: Normal)   Pulse 70   Ht '5\' 1"'$  (1.549 m) Comment: reported  Wt 133 lb (60.3 kg)   LMP 06/20/2022 (Approximate)   BMI 25.13 kg/m   Height: '5\' 1"'$  (154.9 cm) (reported)  General appearance: alert, cooperative and appears stated age Head: Normocephalic, without obvious abnormality, atraumatic Neck: no adenopathy, supple, symmetrical,  trachea midline and thyroid normal to inspection and palpation Lungs: clear to auscultation bilaterally Breasts: normal appearance, no masses or tenderness Heart: regular rate and rhythm Abdomen: soft, non-tender; bowel sounds normal; no masses,  no organomegaly Extremities: extremities normal, atraumatic, no cyanosis or edema Skin: Skin color, texture, turgor normal. No rashes or lesions Lymph nodes: Cervical, supraclavicular, and axillary nodes normal. No abnormal inguinal nodes palpated Neurologic: Grossly normal   Pelvic: External genitalia:  no lesions              Urethra:  normal appearing urethra with no masses, tenderness or lesions              Bartholins and Skenes: normal                 Vagina: normal appearing vagina with normal color and no discharge, no lesions              Cervix: no lesions              Pap taken: No. Bimanual Exam:  Uterus:  enlarged, 8-10 weeks size              Adnexa: no mass, fullness, tenderness               Rectovaginal: Confirms               Anus:  normal sphincter tone, no lesions  Chaperone, Octaviano Batty, CMA, was present for exam.  Assessment/Plan: 1. Well woman exam with routine gynecological exam - Pap smear 05/2020 - Mammogram 04/2022 with normal follow up - Colonoscopy declined.  She was referred last year.  Has family hx of colon cancer so should not do cologuard.  States she will do at age 26. - lab work done with PCP, Dr. Inda Merlin, who has now retired.  Will be assigned a new provider - vaccines reviewed/updated  2. Encounter for surveillance of contraceptive pills - starting Slynd  3. HSV infection - valACYclovir (VALTREX) 1000 MG tablet; Take 1 tablet (1,000 mg total) by mouth 2 (two) times daily. Take for 3 days  Dispense: 6 tablet; Refill: 2  4. H/O LEEP - 1991  5. DUB (dysfunctional uterine bleeding) - Drospirenone (SLYND) 4 MG TABS; Take 1 tablet by mouth daily in the afternoon.  Dispense: 28 tablet; Refill: 3 - will  repeat ultrasound if does not make a big difference with cycles.  RFA of uterine fibroids discussed.  Pt comfortable with plan and will give update in 2- 3 months  6. Recurrent vaginitis - discussed coming in for testing with self swab with symptoms  7. Family history of colon cancer - declined referral for colonoscopy at this time

## 2022-07-31 ENCOUNTER — Other Ambulatory Visit (HOSPITAL_BASED_OUTPATIENT_CLINIC_OR_DEPARTMENT_OTHER): Payer: Self-pay | Admitting: Obstetrics & Gynecology

## 2022-08-01 ENCOUNTER — Other Ambulatory Visit (HOSPITAL_COMMUNITY)
Admission: RE | Admit: 2022-08-01 | Discharge: 2022-08-01 | Disposition: A | Discharge status: Home / Self Care | Payer: 59 | Source: Ambulatory Visit | Attending: Obstetrics & Gynecology | Admitting: Obstetrics & Gynecology

## 2022-08-01 ENCOUNTER — Ambulatory Visit (HOSPITAL_BASED_OUTPATIENT_CLINIC_OR_DEPARTMENT_OTHER): Payer: 59

## 2022-08-01 DIAGNOSIS — N898 Other specified noninflammatory disorders of vagina: Secondary | ICD-10-CM | POA: Insufficient documentation

## 2022-08-01 NOTE — Progress Notes (Signed)
Patient came in today to do aptima self swab for yeast and BV. Patient is experiencing some vaginal itching and discharge. tbw

## 2022-08-04 LAB — CERVICOVAGINAL ANCILLARY ONLY
Bacterial Vaginitis (gardnerella): POSITIVE — AB
Candida Glabrata: NEGATIVE
Candida Vaginitis: POSITIVE — AB
Comment: NEGATIVE
Comment: NEGATIVE
Comment: NEGATIVE

## 2022-08-05 ENCOUNTER — Other Ambulatory Visit (HOSPITAL_BASED_OUTPATIENT_CLINIC_OR_DEPARTMENT_OTHER): Payer: Self-pay

## 2022-08-05 DIAGNOSIS — N760 Acute vaginitis: Secondary | ICD-10-CM

## 2022-08-05 MED ORDER — METRONIDAZOLE 500 MG PO TABS: 500.0000 mg | ORAL_TABLET | Freq: Two times a day (BID) | ORAL | 0 refills | Status: DC

## 2022-09-22 ENCOUNTER — Other Ambulatory Visit (HOSPITAL_BASED_OUTPATIENT_CLINIC_OR_DEPARTMENT_OTHER): Payer: Self-pay | Admitting: *Deleted

## 2022-09-22 DIAGNOSIS — Z8742 Personal history of other diseases of the female genital tract: Secondary | ICD-10-CM

## 2022-09-22 MED ORDER — NORETHINDRONE 0.35 MG PO TABS: 1.0000 | ORAL_TABLET | Freq: Every day | ORAL | 2 refills | Status: DC

## 2022-10-22 ENCOUNTER — Telehealth (HOSPITAL_BASED_OUTPATIENT_CLINIC_OR_DEPARTMENT_OTHER): Payer: Self-pay | Admitting: *Deleted

## 2022-10-22 NOTE — Telephone Encounter (Signed)
Patient called to set up appointment for surgery for ablation .

## 2022-10-27 ENCOUNTER — Other Ambulatory Visit (HOSPITAL_BASED_OUTPATIENT_CLINIC_OR_DEPARTMENT_OTHER): Payer: Self-pay | Admitting: Obstetrics & Gynecology

## 2022-10-28 NOTE — Telephone Encounter (Signed)
Returned pts call. Pt states that she would like to proceed with surgery. Advised that I would let provider know.

## 2022-10-29 ENCOUNTER — Other Ambulatory Visit (HOSPITAL_BASED_OUTPATIENT_CLINIC_OR_DEPARTMENT_OTHER): Payer: 59

## 2022-10-29 ENCOUNTER — Ambulatory Visit (HOSPITAL_BASED_OUTPATIENT_CLINIC_OR_DEPARTMENT_OTHER)
Admission: RE | Admit: 2022-10-29 | Discharge: 2022-10-29 | Disposition: A | Discharge status: Home / Self Care | Payer: 59 | Source: Ambulatory Visit | Attending: Obstetrics & Gynecology | Admitting: Obstetrics & Gynecology

## 2022-10-29 ENCOUNTER — Encounter (HOSPITAL_BASED_OUTPATIENT_CLINIC_OR_DEPARTMENT_OTHER): Payer: Self-pay | Admitting: Obstetrics & Gynecology

## 2022-10-29 ENCOUNTER — Other Ambulatory Visit (HOSPITAL_BASED_OUTPATIENT_CLINIC_OR_DEPARTMENT_OTHER): Payer: Self-pay | Admitting: Obstetrics & Gynecology

## 2022-10-29 DIAGNOSIS — D252 Subserosal leiomyoma of uterus: Secondary | ICD-10-CM | POA: Diagnosis not present

## 2022-10-29 DIAGNOSIS — Z8742 Personal history of other diseases of the female genital tract: Secondary | ICD-10-CM | POA: Diagnosis not present

## 2022-10-29 MED ORDER — NORETHINDRONE ACETATE 5 MG PO TABS: 10.0000 mg | ORAL_TABLET | Freq: Two times a day (BID) | ORAL | 1 refills | Status: DC

## 2022-10-29 NOTE — Progress Notes (Signed)
Called pt personally.  Expressed my apologies for the medication being called into pharmacy.  Pt was able to get back all of her money for her trip.  Sent in prescription to pharmacy for pt.  Ordered ultrasound.  She is going to call to get this set up.  The day she comes for ultrasound, she will have lab work done as well.  Orders placed.  She will let me know when she has the ultrasound scheduled.

## 2022-10-30 LAB — CBC
Hematocrit: 42.3 % (ref 34.0–46.6)
Hemoglobin: 14.2 g/dL (ref 11.1–15.9)
MCH: 31.6 pg (ref 26.6–33.0)
MCHC: 33.6 g/dL (ref 31.5–35.7)
MCV: 94 fL (ref 79–97)
Platelets: 221 10*3/uL (ref 150–450)
RBC: 4.49 x10E6/uL (ref 3.77–5.28)
RDW: 12.9 % (ref 11.7–15.4)
WBC: 9 10*3/uL (ref 3.4–10.8)

## 2022-10-30 LAB — IRON,TIBC AND FERRITIN PANEL
Ferritin: 51 ng/mL (ref 15–150)
Iron Saturation: 41 % (ref 15–55)
Iron: 141 ug/dL (ref 27–159)
Total Iron Binding Capacity: 340 ug/dL (ref 250–450)
UIBC: 199 ug/dL (ref 131–425)

## 2022-11-03 ENCOUNTER — Encounter (HOSPITAL_BASED_OUTPATIENT_CLINIC_OR_DEPARTMENT_OTHER): Payer: Self-pay | Admitting: Obstetrics & Gynecology

## 2022-11-04 ENCOUNTER — Encounter (HOSPITAL_BASED_OUTPATIENT_CLINIC_OR_DEPARTMENT_OTHER): Payer: Self-pay

## 2022-11-14 ENCOUNTER — Encounter (HOSPITAL_COMMUNITY): Payer: Self-pay | Admitting: Obstetrics & Gynecology

## 2022-11-14 ENCOUNTER — Other Ambulatory Visit: Payer: Self-pay

## 2022-11-14 ENCOUNTER — Other Ambulatory Visit (HOSPITAL_BASED_OUTPATIENT_CLINIC_OR_DEPARTMENT_OTHER): Payer: Self-pay | Admitting: Obstetrics & Gynecology

## 2022-11-14 DIAGNOSIS — Z01818 Encounter for other preprocedural examination: Secondary | ICD-10-CM

## 2022-11-14 NOTE — Progress Notes (Addendum)
SDW call   PCP - Dr. Inda Merlin   PPM/ICD - Denies   Chest x-ray - 09/18/2017 EKG -  n/a Stress Test - n/a ECHO - n/a Cardiac Cath - n/a   Sleep Study/sleep apnea/CPAP: Denies  Non-diabetic   Blood Thinner Instructions: Denies Aspirin Instructions:Denies   ERAS Protcol - Yes, clear liquids until 3 hours prior to surgery. Last PO fluid at 1000 PRE-SURGERY Ensure or G2- No   COVID TEST- N/a    Anesthesia review: No   Patient denies shortness of breath, fever, cough and chest pain over the phone call    Your procedure is scheduled on Tuesday November 18, 2022  Report to [facility] Entrance "A" at 1030 A.M., then check in with the Admitting office.  Call this number if you have problems the morning of surgery:  [phone]   If you have any questions prior to your surgery date call [phone]: Open Monday-Friday 8am-4pm If you experience any cold or flu symptoms such as cough, fever, chills, shortness of breath, etc. between now and your scheduled surgery, please notify us at the above number     Remember:  Do not eat after midnight the night before your surgery  You may drink clear liquids until 1000 the morning of your surgery.   Clear liquids allowed are: Water, Non-Citrus Juices (without pulp), Carbonated Beverages, Clear Tea, Black Coffee ONLY (NO MILK, CREAM OR POWDERED CREAMER of any kind), and Gatorade   Take these medicines the morning of surgery with A SIP OF WATER: Norethindrone  As of today, STOP taking any Aspirin (unless otherwise instructed by your surgeon) Aleve, Naproxen, Ibuprofen, Motrin, Advil, Goody's, BC's, all herbal medications, fish oil, and all vitamins.

## 2022-11-18 ENCOUNTER — Ambulatory Visit (HOSPITAL_BASED_OUTPATIENT_CLINIC_OR_DEPARTMENT_OTHER): Payer: 59 | Admitting: Anesthesiology

## 2022-11-18 ENCOUNTER — Other Ambulatory Visit: Payer: Self-pay

## 2022-11-18 ENCOUNTER — Encounter (HOSPITAL_COMMUNITY)
Admission: RE | Disposition: A | Discharge status: Home / Self Care | Payer: Self-pay | Source: Home / Self Care | Attending: Obstetrics & Gynecology

## 2022-11-18 ENCOUNTER — Ambulatory Visit (HOSPITAL_COMMUNITY)
Admission: RE | Admit: 2022-11-18 | Discharge: 2022-11-18 | Disposition: A | Discharge status: Home / Self Care | Payer: 59 | Attending: Obstetrics & Gynecology | Admitting: Obstetrics & Gynecology

## 2022-11-18 ENCOUNTER — Encounter (HOSPITAL_COMMUNITY): Payer: Self-pay | Admitting: Obstetrics & Gynecology

## 2022-11-18 ENCOUNTER — Ambulatory Visit (HOSPITAL_COMMUNITY): Payer: 59 | Admitting: Anesthesiology

## 2022-11-18 DIAGNOSIS — D252 Subserosal leiomyoma of uterus: Secondary | ICD-10-CM | POA: Insufficient documentation

## 2022-11-18 DIAGNOSIS — D251 Intramural leiomyoma of uterus: Secondary | ICD-10-CM | POA: Diagnosis not present

## 2022-11-18 DIAGNOSIS — D250 Submucous leiomyoma of uterus: Secondary | ICD-10-CM

## 2022-11-18 DIAGNOSIS — Z8742 Personal history of other diseases of the female genital tract: Secondary | ICD-10-CM

## 2022-11-18 DIAGNOSIS — N938 Other specified abnormal uterine and vaginal bleeding: Secondary | ICD-10-CM | POA: Insufficient documentation

## 2022-11-18 DIAGNOSIS — Z01818 Encounter for other preprocedural examination: Secondary | ICD-10-CM

## 2022-11-18 DIAGNOSIS — D259 Leiomyoma of uterus, unspecified: Secondary | ICD-10-CM

## 2022-11-18 DIAGNOSIS — Z862 Personal history of diseases of the blood and blood-forming organs and certain disorders involving the immune mechanism: Secondary | ICD-10-CM

## 2022-11-18 DIAGNOSIS — F419 Anxiety disorder, unspecified: Secondary | ICD-10-CM | POA: Insufficient documentation

## 2022-11-18 HISTORY — DX: Family history of other specified conditions: Z84.89

## 2022-11-18 HISTORY — PX: DILITATION & CURRETTAGE/HYSTROSCOPY WITH HYDROTHERMAL ABLATION: SHX5570

## 2022-11-18 LAB — CBC
HCT: 42.8 % (ref 36.0–46.0)
Hemoglobin: 14.7 g/dL (ref 12.0–15.0)
MCH: 32.2 pg (ref 26.0–34.0)
MCHC: 34.3 g/dL (ref 30.0–36.0)
MCV: 93.7 fL (ref 80.0–100.0)
Platelets: 253 10*3/uL (ref 150–400)
RBC: 4.57 MIL/uL (ref 3.87–5.11)
RDW: 12.8 % (ref 11.5–15.5)
WBC: 7.1 10*3/uL (ref 4.0–10.5)
nRBC: 0 % (ref 0.0–0.2)

## 2022-11-18 LAB — POCT PREGNANCY, URINE: Preg Test, Ur: NEGATIVE

## 2022-11-18 LAB — TYPE AND SCREEN
ABO/RH(D): A POS
Antibody Screen: NEGATIVE

## 2022-11-18 LAB — HCG, SERUM, QUALITATIVE: Preg, Serum: NEGATIVE

## 2022-11-18 LAB — ABO/RH: ABO/RH(D): A POS

## 2022-11-18 SURGERY — DILATATION & CURETTAGE/HYSTEROSCOPY WITH HYDROTHERMAL ABLATION
Anesthesia: General | Site: Uterus

## 2022-11-18 MED ORDER — EPHEDRINE SULFATE-NACL 50-0.9 MG/10ML-% IV SOSY
PREFILLED_SYRINGE | INTRAVENOUS | Status: DC | PRN
  Administered 2022-11-18: 10 mg via INTRAVENOUS
  Administered 2022-11-18: 5 mg via INTRAVENOUS
  Administered 2022-11-18: 15 mg via INTRAVENOUS

## 2022-11-18 MED ORDER — ACETAMINOPHEN 10 MG/ML IV SOLN: 1000.0000 mg | Freq: Once | INTRAVENOUS | Status: DC | PRN

## 2022-11-18 MED ORDER — SODIUM CHLORIDE 0.9 % IR SOLN
Status: DC | PRN
  Administered 2022-11-18 (×3): 3000 mL

## 2022-11-18 MED ORDER — LIDOCAINE 2% (20 MG/ML) 5 ML SYRINGE
INTRAMUSCULAR | Status: DC | PRN
  Administered 2022-11-18: 60 mg via INTRAVENOUS

## 2022-11-18 MED ORDER — MIDAZOLAM HCL 2 MG/2ML IJ SOLN
INTRAMUSCULAR | Status: DC | PRN
  Administered 2022-11-18: 2 mg via INTRAVENOUS

## 2022-11-18 MED ORDER — SODIUM CHLORIDE (PF) 0.9 % IJ SOLN
INTRAMUSCULAR | Status: AC
  Filled 2022-11-18: qty 100

## 2022-11-18 MED ORDER — KETOROLAC TROMETHAMINE 30 MG/ML IJ SOLN
INTRAMUSCULAR | Status: DC | PRN
  Administered 2022-11-18: 30 mg via INTRAVENOUS

## 2022-11-18 MED ORDER — HYDROCODONE-ACETAMINOPHEN 5-325 MG PO TABS: 1.0000 | ORAL_TABLET | Freq: Four times a day (QID) | ORAL | 0 refills | Status: DC | PRN

## 2022-11-18 MED ORDER — CHLORHEXIDINE GLUCONATE 0.12 % MT SOLN: 15.0000 mL | Freq: Once | OROMUCOSAL | Status: DC

## 2022-11-18 MED ORDER — ONDANSETRON HCL 4 MG/2ML IJ SOLN
INTRAMUSCULAR | Status: DC | PRN
  Administered 2022-11-18: 4 mg via INTRAVENOUS

## 2022-11-18 MED ORDER — ORAL CARE MOUTH RINSE: 15.0000 mL | Freq: Once | OROMUCOSAL | Status: DC

## 2022-11-18 MED ORDER — 0.9 % SODIUM CHLORIDE (POUR BTL) OPTIME
TOPICAL | Status: DC | PRN
  Administered 2022-11-18: 1000 mL

## 2022-11-18 MED ORDER — FENTANYL CITRATE (PF) 100 MCG/2ML IJ SOLN: 25.0000 ug | INTRAMUSCULAR | Status: DC | PRN

## 2022-11-18 MED ORDER — VASOPRESSIN 20 UNIT/ML IV SOLN
INTRAVENOUS | Status: AC
  Filled 2022-11-18: qty 1

## 2022-11-18 MED ORDER — VASOPRESSIN 20 UNIT/ML IV SOLN
INTRAVENOUS | Status: DC | PRN
  Administered 2022-11-18: 10 mL

## 2022-11-18 MED ORDER — PROPOFOL 10 MG/ML IV BOLUS
INTRAVENOUS | Status: DC | PRN
  Administered 2022-11-18: 140 mg via INTRAVENOUS

## 2022-11-18 MED ORDER — FENTANYL CITRATE (PF) 250 MCG/5ML IJ SOLN
INTRAMUSCULAR | Status: AC
  Filled 2022-11-18: qty 5

## 2022-11-18 MED ORDER — POVIDONE-IODINE 10 % EX SWAB: 2.0000 | Freq: Once | CUTANEOUS | Status: DC

## 2022-11-18 MED ORDER — NORETHINDRONE ACETATE 5 MG PO TABS
10.0000 mg | ORAL_TABLET | Freq: Every day | ORAL | 1 refills | Status: DC
Start: 2022-11-18 — End: 2023-01-20

## 2022-11-18 MED ORDER — LACTATED RINGERS IV SOLN: INTRAVENOUS | Status: DC

## 2022-11-18 MED ORDER — FENTANYL CITRATE (PF) 250 MCG/5ML IJ SOLN
INTRAMUSCULAR | Status: DC | PRN
  Administered 2022-11-18: 50 ug via INTRAVENOUS

## 2022-11-18 MED ORDER — IBUPROFEN 800 MG PO TABS: 800.0000 mg | ORAL_TABLET | Freq: Three times a day (TID) | ORAL | 0 refills | Status: DC | PRN

## 2022-11-18 MED ORDER — MIDAZOLAM HCL 2 MG/2ML IJ SOLN
INTRAMUSCULAR | Status: AC
  Filled 2022-11-18: qty 2

## 2022-11-18 MED ORDER — DEXAMETHASONE SODIUM PHOSPHATE 10 MG/ML IJ SOLN
INTRAMUSCULAR | Status: DC | PRN
  Administered 2022-11-18: 10 mg via INTRAVENOUS

## 2022-11-18 MED ORDER — LIDOCAINE-EPINEPHRINE 1 %-1:100000 IJ SOLN
INTRAMUSCULAR | Status: DC | PRN
  Administered 2022-11-18: 10 mL

## 2022-11-18 MED ORDER — KETOROLAC TROMETHAMINE 30 MG/ML IJ SOLN
INTRAMUSCULAR | Status: AC
  Filled 2022-11-18: qty 4

## 2022-11-18 SURGICAL SUPPLY — 25 items
CATH ROBINSON RED A/P 16FR (CATHETERS) IMPLANT
COVER MAYO STAND STRL (DRAPES) ×1 IMPLANT
CURETTE PIPELLE ENDOMTRL SUCTN (MISCELLANEOUS) IMPLANT
GLOVE BIOGEL PI IND STRL 7.0 (GLOVE) ×2 IMPLANT
GLOVE ECLIPSE 7.0 STRL STRAW (GLOVE) ×2 IMPLANT
GOWN STRL REUS W/ TWL LRG LVL3 (GOWN DISPOSABLE) ×1 IMPLANT
GOWN STRL REUS W/ TWL XL LVL3 (GOWN DISPOSABLE) ×1 IMPLANT
GOWN STRL REUS W/TWL LRG LVL3 (GOWN DISPOSABLE) ×1
GOWN STRL REUS W/TWL XL LVL3 (GOWN DISPOSABLE) ×1
KIT PROCEDURE FLUENT (KITS) IMPLANT
MYOSURE XL FIBROID (MISCELLANEOUS) ×1
NDL 18GX1X1/2 (RX/OR ONLY) (NEEDLE) IMPLANT
NEEDLE 18GX1X1/2 (RX/OR ONLY) (NEEDLE) ×1 IMPLANT
NS IRRIG 1000ML POUR BTL (IV SOLUTION) ×1 IMPLANT
PACK VAGINAL MINOR WOMEN LF (CUSTOM PROCEDURE TRAY) ×1 IMPLANT
PAD OB MATERNITY 4.3X12.25 (PERSONAL CARE ITEMS) ×1 IMPLANT
PIPELLE ENDOMETRIAL SUCTION CU (MISCELLANEOUS) ×1
SEAL ROD LENS SCOPE MYOSURE (ABLATOR) IMPLANT
SET GENESYS HTA PROCERVA (MISCELLANEOUS) ×1 IMPLANT
SYR 5ML LUER SLIP (SYRINGE) IMPLANT
SYR TB 1ML LUER SLIP (SYRINGE) IMPLANT
SYR TOOMEY 50ML (SYRINGE) ×1 IMPLANT
SYSTEM TISS REMOVAL MYOSURE XL (MISCELLANEOUS) IMPLANT
TOWEL GREEN STERILE FF (TOWEL DISPOSABLE) ×1 IMPLANT
UNDERPAD 30X36 HEAVY ABSORB (UNDERPADS AND DIAPERS) ×1 IMPLANT

## 2022-11-18 NOTE — Anesthesia Preprocedure Evaluation (Signed)
Anesthesia Evaluation  Patient identified by MRN, date of birth, ID band Patient awake    Reviewed: Allergy & Precautions, NPO status , Patient's Chart, lab work & pertinent test results  Airway Mallampati: II  TM Distance: >3 FB Neck ROM: Full    Dental no notable dental hx.    Pulmonary neg pulmonary ROS   Pulmonary exam normal        Cardiovascular negative cardio ROS  Rhythm:Regular Rate:Normal     Neuro/Psych   Anxiety     negative neurological ROS     GI/Hepatic negative GI ROS, Neg liver ROS,,,  Endo/Other  negative endocrine ROS    Renal/GU negative Renal ROS  Female GU complaint     Musculoskeletal negative musculoskeletal ROS (+)    Abdominal Normal abdominal exam  (+)   Peds  Hematology negative hematology ROS (+)   Anesthesia Other Findings   Reproductive/Obstetrics                             Anesthesia Physical Anesthesia Plan  ASA: 2  Anesthesia Plan: General   Post-op Pain Management:    Induction: Intravenous  PONV Risk Score and Plan: 3 and Ondansetron, Dexamethasone, Midazolam and Treatment may vary due to age or medical condition  Airway Management Planned: Mask and LMA  Additional Equipment: None  Intra-op Plan:   Post-operative Plan: Extubation in OR  Informed Consent: I have reviewed the patients History and Physical, chart, labs and discussed the procedure including the risks, benefits and alternatives for the proposed anesthesia with the patient or authorized representative who has indicated his/her understanding and acceptance.     Dental advisory given  Plan Discussed with: CRNA  Anesthesia Plan Comments:        Anesthesia Quick Evaluation

## 2022-11-18 NOTE — Anesthesia Postprocedure Evaluation (Signed)
Anesthesia Post Note  Patient: Merilda Tyndale Cayer  Procedure(s) Performed: DILATATION & CURETTAGE/HYSTEROSCOPY WITH MYOSURE RESECTION OF FIBROID, ENDOMETRIAL BIOPSY. (Uterus)     Patient location during evaluation: PACU Anesthesia Type: General Level of consciousness: awake and alert Pain management: pain level controlled Vital Signs Assessment: post-procedure vital signs reviewed and stable Respiratory status: spontaneous breathing, nonlabored ventilation, respiratory function stable and patient connected to nasal cannula oxygen Cardiovascular status: blood pressure returned to baseline and stable Postop Assessment: no apparent nausea or vomiting Anesthetic complications: no   No notable events documented.  Last Vitals:  Vitals:   11/18/22 1445 11/18/22 1500  BP: 130/79 (!) 141/85  Pulse: (!) 102 95  Resp: 15 11  Temp:  (!) 36.2 C  SpO2: 100% 98%    Last Pain:  Vitals:   11/18/22 1500  TempSrc:   PainSc: 0-No pain                 Belenda Cruise P Kira Hartl

## 2022-11-18 NOTE — Transfer of Care (Signed)
Immediate Anesthesia Transfer of Care Note  Patient: Sarah George  Procedure(s) Performed: DILATATION & CURETTAGE/HYSTEROSCOPY WITH MYOSURE RESECTION OF FIBROID, ENDOMETRIAL BIOPSY. (Uterus)  Patient Location: PACU  Anesthesia Type:General  Level of Consciousness: awake, alert , and oriented  Airway & Oxygen Therapy: Patient Spontanous Breathing  Post-op Assessment: Report given to RN  Post vital signs: Reviewed and stable  Last Vitals:  Vitals Value Taken Time  BP 135/71 11/18/22 1431  Temp    Pulse 114 11/18/22 1434  Resp 14 11/18/22 1434  SpO2 98 % 11/18/22 1434  Vitals shown include unvalidated device data.  Last Pain:  Vitals:   11/18/22 1053  TempSrc:   PainSc: 0-No pain         Complications: No notable events documented.

## 2022-11-18 NOTE — Op Note (Signed)
11/18/2022  2:35 PM  PATIENT:  Sarah George  52 y.o. female  PRE-OPERATIVE DIAGNOSIS:  DUB, Subserosal Fibroid, Intramural Fibroid  POST-OPERATIVE DIAGNOSIS:  DUB, Subserosal Fibroid, Submucosal fibroid  PROCEDURE:  Procedure(s): DILATATION & CURETTAGE/HYSTEROSCOPY WITH MYOSURE RESECTION OF FIBROID, ENDOMETRIAL BIOPSY.  SURGEON:  Megan Salon  ASSISTANTS: OR staff.  An experienced assistant was required given the standard of surgical care given the complexity of the case.  This assistant was needed for exposure, dissection, suctioning, retraction, instrument exchange and for overall help during the procedure.  RNFA help was also unavailable.  ANESTHESIA:   general  ESTIMATED BLOOD LOSS: 10 mL  BLOOD ADMINISTERED:none   FLUIDS: 700cc LR  UOP: 200 cc clear UOP  SPECIMEN:  1)  endometrial biopsy 2) myoma  DISPOSITION OF SPECIMEN:  PATHOLOGY  FINDINGS: fibroid that filled the endometrial cavity  DESCRIPTION OF OPERATION: Patient was taken to the operating room.  She is placed in the supine position. SCDs were on her lower extremities and functioning properly. General anesthesia with an LMA was administered without difficulty. Dr. Gloris Manchester, anesthesia, oversaw case.  Legs were then placed in the [place] in the low lithotomy position. The legs were lifted to the high lithotomy position and the Betadine prep was used on the inner thighs perineum and vagina x3. Patient was draped in a normal standard fashion. An in and out catheterization with a red rubber Foley catheter was performed. Approximately 200 cc of clear urine was noted. A bivalve speculum was placed the vagina. The anterior lip of the cervix was grasped with single-tooth tenaculum.  A paracervical block of 1% lidocaine mixed one-to-one with epinephrine (1:100,000 units).  10 cc was used total. The cervix is dilated up to #23 [facility] dilators. The endometrial cavity sounded to 7 cm.   A HTA hysteroscope was initially  used to view the endometrial cavity.  Due to poor visualization, this was switched to the larger myosure scope.  Once passed into the endometrial cavity, a large myoma was present.  This was almost filling the entire cavity and much larger than appeared on ultrasound.   Petressin mixed 1:20 with NS was then instilled into the cervix.  10 cc total was used.    Normal saline was used as a hysteroscopic fluid.  Using the myosure XL device, the fibroid was resected until almost flush with the fundus.  It did have a fundal attachment and could not be completely removed.  Approximately 90% was removed.  At this point, decision was made not to proceed with endometrial ablation as I was unsure how much myometrium has been removed.  The procedure was ended.   The hysteroscope was removed. The fluid deficit was 275 cc. The tenaculum was removed from the anterior lip of the cervix. The speculum was removed from the vagina. The prep was cleansed of the patient's skin. The legs are positioned back in the supine position. Sponge, lap, needle, instrument counts were correct x2. Patient was taken to recovery in stable condition.   COUNTS:  YES  PLAN OF CARE: Transfer to PACU

## 2022-11-18 NOTE — H&P (Signed)
Sarah George is an 52 y.o. female G0 SWF with hx of uterine fibroids here for endometrial ablation, possible fibroid resection, dilation and curettage due to menorrhagia.  Last episode of bleeding was very heavy.  We have discussed her bleeding and fibroids over the past several years and most recent bleeding has made her decided to proceed with treatment.  Evaluation has included   Procedure discussed with patient.  Recovery and pain management discussed.  Risks discussed including but not limited to bleeding, rare risk of transfusion, infection, 1% risk of uterine perforation with risks of fluid deficit causing cardiac arrythmia, cerebral swelling and/or need to stop procedure early.  Fluid emboli and rare risk of death discussed.  DVT/PE, rare risk of risk of bowel/bladder/ureteral/vascular injury.  Patient aware if pathology abnormal she may need additional treatment.  She also knows she should not attempt pregnancy after this procedure.  Will continue OCP.  All questions answered.     Pertinent Gynecological History: Menses:  has some irregular bleeding with progesterone only pills Bleeding: can be very heavy Contraception:  POPs DES exposure: denies Blood transfusions: none Sexually transmitted diseases: no past history Previous GYN Procedures:  none   Last mammogram: normal Date: 05/30/2022 Last pap: normal Date: 06/01/2020 OB History: G0, P0   Menstrual History: Patient's last menstrual period was 11/10/2022 (approximate).    Past Medical History:  Diagnosis Date   Anxiety    Deviated nasal septum    Family history of adverse reaction to anesthesia    mom with PONV   Fibroids, submucosal 04/14/2018   several fibroids measuring 8 x 45mm, 2.3 x 2.3cm, 8 x 60mm, and 10 x 98mm   History of abnormal Pap smear 09/1991   LEEP, CIN I   History of spider veins    Left cataract    History of    Lipoma 03/24/2014   Left supraclavicular    Past Surgical History:  Procedure  Laterality Date   ABDOMINOPLASTY  07/17/2021   in [place]   CATARACT EXTRACTION Left 2012   [place] N/A 05/03/2018   Procedure: [place];  Surgeon: Megan Salon, MD;  Location: [facility];  Service: Gynecology;  Laterality: N/A;  Fibroid resection   LAPAROSCOPIC OVARIAN CYSTECTOMY Left 05/03/2018   Procedure: LAPAROSCOPIC OVARIAN CYSTECTOMY  possible LSO;  Surgeon: Megan Salon, MD;  Location: [facility];  Service: Gynecology;  Laterality: Left;  Possible LSO   LEEP  1993   CIN I   RHINOPLASTY  1997   Sclerotherapy  11/06/2015   Septorhinoplasty  01/23/2005   TONSILLECTOMY  06/2013    Family History  Problem Relation Age of Onset   Breast cancer Maternal Aunt    Breast cancer Cousin        maternal   Colon cancer Mother 18   Colon cancer Maternal Grandmother    Colon cancer Maternal Grandfather    Colon cancer Maternal Uncle    Lung cancer Paternal Grandfather     Social History:  reports that she has never smoked. She has never used smokeless tobacco. She reports that she does not currently use alcohol. She reports that she does not use drugs.  Allergies:  Allergies  Allergen Reactions   Topamax [Topiramate]     agitation and insomnia    Medications Prior to Admission  Medication Sig Dispense Refill Last Dose   Ascorbic Acid (VITA-C PO) Take 2 drops by mouth daily.  Past Week   Biotin 5000 MCG TABS Take 5,000 mcg by mouth daily.   Past Week   Cyanocobalamin (B-12) 1000 MCG SUBL Place 1 drop under the tongue daily.   Past Week   magnesium oxide (MAG-OX) 400 (240 Mg) MG tablet Take 800 mg by mouth daily.   Past Week   MELATONIN PO Take 1 tablet by mouth at bedtime as needed (sleep).   Past Month   Multiple Vitamin (MULTIVITAMIN) tablet Take 4 tablets by mouth daily. NutriFol   Past Week   norethindrone (AYGESTIN) 5 MG tablet Take 2 tablets (10 mg total) by  mouth 2 (two) times daily. 120 tablet 1 11/18/2022   OVER THE COUNTER MEDICATION Take 1 drop by mouth daily. CBD Oil   Past Week   valACYclovir (VALTREX) 1000 MG tablet Take 1 tablet (1,000 mg total) by mouth 2 (two) times daily. Take for 3 days (Patient not taking: Reported on 11/13/2022) 6 tablet 2 Not Taking    Review of Systems  Constitutional: Negative.   Respiratory: Negative.    Cardiovascular: Negative.   Genitourinary:        Heavy menstrual bleeding    Blood pressure (!) 142/88, pulse 90, temperature 98.4 F (36.9 C), temperature source Oral, resp. rate 20, height 5\' 1"  (1.549 m), weight 58.1 kg, last menstrual period 11/10/2022, SpO2 98 %. Physical Exam Constitutional:      Appearance: Normal appearance.  Cardiovascular:     Rate and Rhythm: Normal rate and regular rhythm.  Pulmonary:     Effort: Pulmonary effort is normal.     Breath sounds: Normal breath sounds.  Skin:    General: Skin is warm and dry.  Neurological:     General: No focal deficit present.     Mental Status: She is alert.  Psychiatric:        Mood and Affect: Mood normal.     Results for orders placed or performed during the hospital encounter of 11/18/22 (from the past 24 hour(s))  Type and screen     Status: None (Preliminary result)   Collection Time: 11/18/22 10:45 AM  Result Value Ref Range   ABO/RH(D) PENDING    Antibody Screen PENDING    Sample Expiration      11/21/2022,2359 Performed at [facility], [place] [address], [place], [place] [address]   CBC     Status: None   Collection Time: 11/18/22 10:47 AM  Result Value Ref Range   WBC 7.1 4.0 - 10.5 K/uL   RBC 4.57 3.87 - 5.11 MIL/uL   Hemoglobin 14.7 12.0 - 15.0 g/dL   HCT 42.8 36.0 - 46.0 %   MCV 93.7 80.0 - 100.0 fL   MCH 32.2 26.0 - 34.0 pg   MCHC 34.3 30.0 - 36.0 g/dL   RDW 12.8 11.5 - 15.5 %   Platelets 253 150 - 400 K/uL   nRBC 0.0 0.0 - 0.2 %  ABO/Rh     Status: None (Preliminary result)   Collection Time: 11/18/22  10:50 AM  Result Value Ref Range   ABO/RH(D) PENDING   Pregnancy, urine POC     Status: None   Collection Time: 11/18/22 11:02 AM  Result Value Ref Range   Preg Test, Ur NEGATIVE NEGATIVE    No results found.  Assessment/Plan: 52 yo G0 SWF with h/o uterine fibroids and menorrhagia here for treatment with endometrial ablation, endometrial sampling and possible fibroid resection.  Questions answered.  Pt ready to proceed.  Stanton Kidney  Ammie Ferrier 11/18/2022, 11:25 AM

## 2022-11-18 NOTE — Discharge Instructions (Signed)
Post-surgical Instructions, Outpatient Surgery ° °You may expect to feel dizzy, weak, and drowsy for as long as 24 hours after receiving the medicine that made you sleep (anesthetic). For the first 24 hours after your surgery:   °· Do not drive a car, ride a bicycle, participate in physical activities, or take public transportation until you are done taking narcotic pain medicines or as directed by Dr. Keiran Sias.  °· Do not drink alcohol or take tranquilizers.  °· Do not take medicine that has not been prescribed by your physicians.  °· Do not sign important papers or make important decisions while on narcotic pain medicines.  °· Have a responsible person with you.  ° °PAIN MANAGEMENT °· Motrin 800mg.  (This is the same as 4-200mg over the counter tablets of Motrin or ibuprofen.)  You may take this every eight hours or as needed for cramping.   °· Vicodin 5/325mg.  For more severe pain, take one or two tablets every four to six hours as needed for pain control.  (Remember that narcotic pain medications increase your risk of constipation.  If this becomes a problem, you may take an over the counter stool softener like Colace 100mg up to four times a day.) ° °DO'S AND DON'T'S °· Do not take a tub bath for one week.  You may shower on the first day after your surgery °· Do not do any heavy lifting for one to two weeks.  This increases the chance of bleeding. °· Do move around as you feel able.  Stairs are fine.  You may begin to exercise again as you feel able.  Do not lift any weights for two weeks. °· Do not put anything in the vagina for two weeks--no tampons, intercourse, or douching.   ° °REGULAR MEDIATIONS/VITAMINS: °· You may restart all of your regular medications as prescribed. °· You may restart all of your vitamins as you normally take them.   ° °PLEASE CALL OR SEEK MEDICAL CARE IF: °· You have persistent nausea and vomiting.  °· You have trouble eating or drinking.  °· You have an oral temperature above 100.5.   °· You have constipation that is not helped by adjusting diet or increasing fluid intake. Pain medicines are a common cause of constipation.  °· You have heavy vaginal bleeding ° °

## 2022-11-18 NOTE — Anesthesia Procedure Notes (Signed)
Procedure Name: LMA Insertion Date/Time: 11/18/2022 12:55 PM  Performed by: Reeves Dam, CRNAPre-anesthesia Checklist: Patient identified, Patient being monitored, Timeout performed, Emergency Drugs available and Suction available Patient Re-evaluated:Patient Re-evaluated prior to induction Oxygen Delivery Method: Circle system utilized Preoxygenation: Pre-oxygenation with 100% oxygen Induction Type: IV induction Ventilation: Mask ventilation without difficulty LMA: LMA inserted LMA Size: 4.0 Tube type: Oral Number of attempts: 1 Placement Confirmation: positive ETCO2 and breath sounds checked- equal and bilateral Tube secured with: Tape Dental Injury: Teeth and Oropharynx as per pre-operative assessment

## 2022-11-19 ENCOUNTER — Encounter (HOSPITAL_COMMUNITY): Payer: Self-pay | Admitting: Obstetrics & Gynecology

## 2022-11-19 LAB — SURGICAL PATHOLOGY

## 2022-11-20 ENCOUNTER — Encounter (HOSPITAL_BASED_OUTPATIENT_CLINIC_OR_DEPARTMENT_OTHER): Payer: Self-pay | Admitting: Obstetrics & Gynecology

## 2022-11-21 ENCOUNTER — Telehealth (HOSPITAL_BASED_OUTPATIENT_CLINIC_OR_DEPARTMENT_OTHER): Payer: Self-pay | Admitting: Obstetrics & Gynecology

## 2022-11-21 ENCOUNTER — Other Ambulatory Visit (HOSPITAL_BASED_OUTPATIENT_CLINIC_OR_DEPARTMENT_OTHER): Payer: Self-pay | Admitting: Obstetrics & Gynecology

## 2022-11-21 DIAGNOSIS — F5101 Primary insomnia: Secondary | ICD-10-CM

## 2022-11-21 MED ORDER — TRAZODONE HCL 50 MG PO TABS
50.0000 mg | ORAL_TABLET | Freq: Every day | ORAL | 0 refills | Status: DC
Start: 2022-11-21 — End: 2022-12-20

## 2022-11-21 NOTE — Telephone Encounter (Signed)
Called pt in response to my chart message.  She is having minimal spotting.  Actually at the movies when I called.  I spoke with pt post operatively and she remembers what we discussed.  She would like to plan repeat u/s in office in 4-6 weeks.  She has restarted her progesterone only pill.    She is having some insomnia issues which was present before surgery.  Discussed trial of trazodone 50mg  dosage.  Rx to pharmacy.  Side effects discussed.

## 2022-11-25 ENCOUNTER — Encounter (HOSPITAL_BASED_OUTPATIENT_CLINIC_OR_DEPARTMENT_OTHER): Payer: Self-pay | Admitting: Obstetrics & Gynecology

## 2022-12-04 ENCOUNTER — Encounter (HOSPITAL_BASED_OUTPATIENT_CLINIC_OR_DEPARTMENT_OTHER): Payer: Self-pay | Admitting: Obstetrics & Gynecology

## 2022-12-10 ENCOUNTER — Other Ambulatory Visit (HOSPITAL_BASED_OUTPATIENT_CLINIC_OR_DEPARTMENT_OTHER): Payer: Self-pay | Admitting: Obstetrics & Gynecology

## 2022-12-10 DIAGNOSIS — D250 Submucous leiomyoma of uterus: Secondary | ICD-10-CM

## 2022-12-10 DIAGNOSIS — N920 Excessive and frequent menstruation with regular cycle: Secondary | ICD-10-CM

## 2022-12-16 ENCOUNTER — Other Ambulatory Visit (HOSPITAL_BASED_OUTPATIENT_CLINIC_OR_DEPARTMENT_OTHER): Payer: Self-pay | Admitting: Obstetrics & Gynecology

## 2022-12-16 ENCOUNTER — Telehealth (HOSPITAL_BASED_OUTPATIENT_CLINIC_OR_DEPARTMENT_OTHER): Payer: 59 | Admitting: Obstetrics & Gynecology

## 2022-12-16 ENCOUNTER — Encounter (HOSPITAL_BASED_OUTPATIENT_CLINIC_OR_DEPARTMENT_OTHER): Payer: Self-pay

## 2022-12-16 DIAGNOSIS — F5101 Primary insomnia: Secondary | ICD-10-CM

## 2022-12-17 ENCOUNTER — Telehealth (INDEPENDENT_AMBULATORY_CARE_PROVIDER_SITE_OTHER): Payer: 59 | Admitting: Obstetrics & Gynecology

## 2022-12-17 DIAGNOSIS — D500 Iron deficiency anemia secondary to blood loss (chronic): Secondary | ICD-10-CM

## 2022-12-17 DIAGNOSIS — R5383 Other fatigue: Secondary | ICD-10-CM | POA: Diagnosis not present

## 2022-12-17 DIAGNOSIS — D250 Submucous leiomyoma of uterus: Secondary | ICD-10-CM

## 2022-12-17 DIAGNOSIS — Z8742 Personal history of other diseases of the female genital tract: Secondary | ICD-10-CM | POA: Diagnosis not present

## 2022-12-17 NOTE — Progress Notes (Signed)
Virtual Visit via Video Note  I connected with Sarah George on 12/17/22 at  1:15 PM EDT by telephone.  A video enabled telemedicine application was planned but pt's lossand verified that I am speaking with the correct person using two identifiers.  Location: Patient: home Provider: office   I discussed the limitations of evaluation and management by telemedicine and the availability of in person appointments. The patient expressed understanding and agreed to proceed.  History of Present Illness: 52 yo G0 SWF for follow up after undergoing hysteroscopy with submucosal myomectomy.  Endometrial ablation was planned at that time but was not done due to size of fibroid and amount of tissue resected.     Observations/Objective: 52 yo G0 SWF who recently underwent hysteroscopic myomectomy.  Ablation was planned at the same time but this was not performed that day due to size of myoma.  Pt is currently on progesterone and bleeding has been moderate.  Has stopped and restarted like possibly a cycle.  She is still interested in ablation if appropriate.  We discussed repeating the ultrasound to see what amount of fibroid is still present vs monitoring to see how bleeding is over the next month or two.  She does have some concerns about anemia and fatigue.  Future lab orders placed.  She is coming on 5/15 for the ultrasound and we will draw the lab work then.  For now, she will stay on the oral progesterone.  Questions answered.  Assessment and Plan: 1. History of menorrhagia  2. Fibroids, submucosal - ultrasound planned for 12/31/2022 and already scheduled  3. Iron deficiency anemia due to chronic blood loss - CBC; Future - Iron, TIBC and Ferritin Panel; Future  4. Other fatigue - B12; Future   Follow Up Instructions: I discussed the assessment and treatment plan with the patient. The patient was provided an opportunity to ask questions and all were answered. The patient agreed with the plan  and demonstrated an understanding of the instructions.   The patient was advised to call back or seek an in-person evaluation if the symptoms worsen or if the condition fails to improve as anticipated.  I provided 23 minutes of non-face-to-face time during this encounter.   Jerene Bears, MD

## 2022-12-23 ENCOUNTER — Encounter (HOSPITAL_BASED_OUTPATIENT_CLINIC_OR_DEPARTMENT_OTHER): Payer: Self-pay | Admitting: Obstetrics & Gynecology

## 2022-12-29 ENCOUNTER — Encounter (HOSPITAL_BASED_OUTPATIENT_CLINIC_OR_DEPARTMENT_OTHER): Payer: Self-pay | Admitting: *Deleted

## 2022-12-31 ENCOUNTER — Ambulatory Visit (HOSPITAL_BASED_OUTPATIENT_CLINIC_OR_DEPARTMENT_OTHER): Payer: 59 | Admitting: Obstetrics & Gynecology

## 2022-12-31 ENCOUNTER — Other Ambulatory Visit (HOSPITAL_BASED_OUTPATIENT_CLINIC_OR_DEPARTMENT_OTHER): Payer: 59

## 2022-12-31 ENCOUNTER — Ambulatory Visit (INDEPENDENT_AMBULATORY_CARE_PROVIDER_SITE_OTHER): Payer: 59

## 2022-12-31 ENCOUNTER — Encounter (HOSPITAL_BASED_OUTPATIENT_CLINIC_OR_DEPARTMENT_OTHER): Payer: Self-pay

## 2022-12-31 DIAGNOSIS — R5383 Other fatigue: Secondary | ICD-10-CM | POA: Diagnosis not present

## 2022-12-31 DIAGNOSIS — D250 Submucous leiomyoma of uterus: Secondary | ICD-10-CM

## 2022-12-31 DIAGNOSIS — D500 Iron deficiency anemia secondary to blood loss (chronic): Secondary | ICD-10-CM | POA: Diagnosis not present

## 2022-12-31 DIAGNOSIS — N920 Excessive and frequent menstruation with regular cycle: Secondary | ICD-10-CM

## 2023-01-01 LAB — CBC
Hematocrit: 45.4 % (ref 34.0–46.6)
Hemoglobin: 15.1 g/dL (ref 11.1–15.9)
MCH: 31.4 pg (ref 26.6–33.0)
MCHC: 33.3 g/dL (ref 31.5–35.7)
MCV: 94 fL (ref 79–97)
Platelets: 219 10*3/uL (ref 150–450)
RBC: 4.81 x10E6/uL (ref 3.77–5.28)
RDW: 12 % (ref 11.7–15.4)
WBC: 6.7 10*3/uL (ref 3.4–10.8)

## 2023-01-01 LAB — IRON,TIBC AND FERRITIN PANEL
Ferritin: 44 ng/mL (ref 15–150)
Iron Saturation: 43 % (ref 15–55)
Iron: 174 ug/dL — ABNORMAL HIGH (ref 27–159)
Total Iron Binding Capacity: 406 ug/dL (ref 250–450)
UIBC: 232 ug/dL (ref 131–425)

## 2023-01-01 LAB — VITAMIN B12: Vitamin B-12: 1892 pg/mL — ABNORMAL HIGH (ref 232–1245)

## 2023-01-12 ENCOUNTER — Other Ambulatory Visit (HOSPITAL_BASED_OUTPATIENT_CLINIC_OR_DEPARTMENT_OTHER): Payer: Self-pay | Admitting: Obstetrics & Gynecology

## 2023-01-12 DIAGNOSIS — Z8742 Personal history of other diseases of the female genital tract: Secondary | ICD-10-CM

## 2023-01-23 DIAGNOSIS — Z0000 Encounter for general adult medical examination without abnormal findings: Secondary | ICD-10-CM | POA: Diagnosis not present

## 2023-01-23 DIAGNOSIS — Z1211 Encounter for screening for malignant neoplasm of colon: Secondary | ICD-10-CM | POA: Diagnosis not present

## 2023-01-23 DIAGNOSIS — Z13220 Encounter for screening for lipoid disorders: Secondary | ICD-10-CM | POA: Diagnosis not present

## 2023-01-23 DIAGNOSIS — R635 Abnormal weight gain: Secondary | ICD-10-CM | POA: Diagnosis not present

## 2023-03-18 ENCOUNTER — Other Ambulatory Visit (HOSPITAL_BASED_OUTPATIENT_CLINIC_OR_DEPARTMENT_OTHER): Payer: Self-pay | Admitting: *Deleted

## 2023-03-18 DIAGNOSIS — Z8742 Personal history of other diseases of the female genital tract: Secondary | ICD-10-CM

## 2023-03-18 MED ORDER — NORETHINDRONE 0.35 MG PO TABS
1.0000 | ORAL_TABLET | Freq: Every day | ORAL | 1 refills | Status: DC
Start: 2023-03-18 — End: 2023-07-22

## 2023-05-26 ENCOUNTER — Encounter (HOSPITAL_BASED_OUTPATIENT_CLINIC_OR_DEPARTMENT_OTHER): Payer: Self-pay

## 2023-05-26 ENCOUNTER — Ambulatory Visit (INDEPENDENT_AMBULATORY_CARE_PROVIDER_SITE_OTHER): Payer: 59

## 2023-05-26 ENCOUNTER — Other Ambulatory Visit (HOSPITAL_COMMUNITY)
Admission: RE | Admit: 2023-05-26 | Discharge: 2023-05-26 | Disposition: A | Discharge status: Home / Self Care | Payer: 59 | Source: Ambulatory Visit | Attending: Obstetrics & Gynecology | Admitting: Obstetrics & Gynecology

## 2023-05-26 VITALS — BP 132/90 | HR 79 | Wt 141.2 lb

## 2023-05-26 DIAGNOSIS — N898 Other specified noninflammatory disorders of vagina: Secondary | ICD-10-CM | POA: Insufficient documentation

## 2023-05-26 NOTE — Progress Notes (Signed)
Patient came in today complaining of vaginal itching. Aptima swab was obtained and sent for evaluation. tbw

## 2023-05-27 LAB — CERVICOVAGINAL ANCILLARY ONLY
Bacterial Vaginitis (gardnerella): NEGATIVE
Candida Glabrata: NEGATIVE
Candida Vaginitis: POSITIVE — AB
Comment: NEGATIVE
Comment: NEGATIVE
Comment: NEGATIVE

## 2023-05-28 ENCOUNTER — Other Ambulatory Visit (HOSPITAL_BASED_OUTPATIENT_CLINIC_OR_DEPARTMENT_OTHER): Payer: Self-pay | Admitting: Obstetrics & Gynecology

## 2023-05-28 MED ORDER — FLUCONAZOLE 150 MG PO TABS: ORAL_TABLET | ORAL | 0 refills | Status: DC

## 2023-05-28 MED ORDER — TERCONAZOLE 0.4 % VA CREA: 1.0000 | TOPICAL_CREAM | Freq: Every day | VAGINAL | 0 refills | Status: DC

## 2023-06-25 ENCOUNTER — Encounter (HOSPITAL_BASED_OUTPATIENT_CLINIC_OR_DEPARTMENT_OTHER): Payer: Self-pay | Admitting: Obstetrics & Gynecology

## 2023-06-29 ENCOUNTER — Other Ambulatory Visit (HOSPITAL_BASED_OUTPATIENT_CLINIC_OR_DEPARTMENT_OTHER): Payer: Self-pay | Admitting: Obstetrics & Gynecology

## 2023-06-29 DIAGNOSIS — D251 Intramural leiomyoma of uterus: Secondary | ICD-10-CM

## 2023-06-29 DIAGNOSIS — N921 Excessive and frequent menstruation with irregular cycle: Secondary | ICD-10-CM

## 2023-06-29 NOTE — Telephone Encounter (Signed)
Called pt with recommendations. Advised that Dr. Hyacinth Meeker would like to do Sonohysterogram to see how much fibroid is left in the endometrial cavity. Pt agrees and scheduled. Advised pt, no intercourse for 2 weeks before procedure to ensure no risk of pregnancy. Pt verbalized understanding.

## 2023-07-07 DIAGNOSIS — R1013 Epigastric pain: Secondary | ICD-10-CM | POA: Diagnosis not present

## 2023-07-07 DIAGNOSIS — J0100 Acute maxillary sinusitis, unspecified: Secondary | ICD-10-CM | POA: Diagnosis not present

## 2023-07-09 ENCOUNTER — Other Ambulatory Visit (HOSPITAL_COMMUNITY): Payer: Self-pay | Admitting: Physician Assistant

## 2023-07-09 DIAGNOSIS — R1013 Epigastric pain: Secondary | ICD-10-CM | POA: Diagnosis not present

## 2023-07-13 ENCOUNTER — Ambulatory Visit (HOSPITAL_COMMUNITY)
Admission: RE | Admit: 2023-07-13 | Discharge: 2023-07-13 | Disposition: A | Discharge status: Home / Self Care | Payer: 59 | Source: Ambulatory Visit | Attending: Physician Assistant | Admitting: Physician Assistant

## 2023-07-13 DIAGNOSIS — D259 Leiomyoma of uterus, unspecified: Secondary | ICD-10-CM | POA: Diagnosis not present

## 2023-07-13 DIAGNOSIS — K7689 Other specified diseases of liver: Secondary | ICD-10-CM | POA: Diagnosis not present

## 2023-07-13 DIAGNOSIS — N2889 Other specified disorders of kidney and ureter: Secondary | ICD-10-CM | POA: Diagnosis not present

## 2023-07-13 DIAGNOSIS — R1013 Epigastric pain: Secondary | ICD-10-CM | POA: Diagnosis not present

## 2023-07-13 MED ORDER — IOHEXOL 350 MG/ML SOLN
75.0000 mL | Freq: Once | INTRAVENOUS | Status: AC | PRN
  Administered 2023-07-13: 75 mL via INTRAVENOUS

## 2023-07-15 ENCOUNTER — Ambulatory Visit (HOSPITAL_COMMUNITY): Payer: 59

## 2023-07-21 ENCOUNTER — Encounter (HOSPITAL_BASED_OUTPATIENT_CLINIC_OR_DEPARTMENT_OTHER): Payer: Self-pay

## 2023-07-21 ENCOUNTER — Telehealth: Payer: Self-pay

## 2023-07-21 NOTE — Telephone Encounter (Signed)
Called patient to let her know Dr. Hyacinth Meeker is available on 08/04/23 for surgery at [facility] at 2:30 pm. Left voicemail asking patient to call me back today to secure the appointment.

## 2023-07-21 NOTE — Telephone Encounter (Signed)
Patient left a voicemail stating that she would like to accept the surgery date w/ Dr. Hyacinth Meeker on 08/03/23 @[facility].

## 2023-07-22 ENCOUNTER — Ambulatory Visit (INDEPENDENT_AMBULATORY_CARE_PROVIDER_SITE_OTHER): Payer: 59 | Admitting: Obstetrics & Gynecology

## 2023-07-22 ENCOUNTER — Ambulatory Visit (INDEPENDENT_AMBULATORY_CARE_PROVIDER_SITE_OTHER): Payer: 59

## 2023-07-22 VITALS — BP 138/90 | HR 85 | Ht 61.0 in | Wt 144.0 lb

## 2023-07-22 DIAGNOSIS — N921 Excessive and frequent menstruation with irregular cycle: Secondary | ICD-10-CM

## 2023-07-22 DIAGNOSIS — Z862 Personal history of diseases of the blood and blood-forming organs and certain disorders involving the immune mechanism: Secondary | ICD-10-CM | POA: Diagnosis not present

## 2023-07-22 DIAGNOSIS — Z8742 Personal history of other diseases of the female genital tract: Secondary | ICD-10-CM | POA: Diagnosis not present

## 2023-07-22 DIAGNOSIS — D251 Intramural leiomyoma of uterus: Secondary | ICD-10-CM

## 2023-07-22 DIAGNOSIS — N938 Other specified abnormal uterine and vaginal bleeding: Secondary | ICD-10-CM | POA: Diagnosis not present

## 2023-07-22 DIAGNOSIS — N2889 Other specified disorders of kidney and ureter: Secondary | ICD-10-CM | POA: Diagnosis not present

## 2023-07-22 DIAGNOSIS — D250 Submucous leiomyoma of uterus: Secondary | ICD-10-CM | POA: Diagnosis not present

## 2023-07-23 DIAGNOSIS — Z791 Long term (current) use of non-steroidal anti-inflammatories (NSAID): Secondary | ICD-10-CM | POA: Diagnosis not present

## 2023-07-23 DIAGNOSIS — Z800 Family history of malignant neoplasm of digestive organs: Secondary | ICD-10-CM | POA: Diagnosis not present

## 2023-07-23 DIAGNOSIS — R1033 Periumbilical pain: Secondary | ICD-10-CM | POA: Diagnosis not present

## 2023-07-23 DIAGNOSIS — N2889 Other specified disorders of kidney and ureter: Secondary | ICD-10-CM | POA: Diagnosis not present

## 2023-07-27 ENCOUNTER — Encounter (HOSPITAL_BASED_OUTPATIENT_CLINIC_OR_DEPARTMENT_OTHER): Payer: Self-pay | Admitting: Obstetrics & Gynecology

## 2023-07-29 NOTE — Progress Notes (Signed)
GYNECOLOGY  VISIT  CC:   discuss ultrasound finding  HPI: 52 y.o. G0P0000 Single White or Caucasian female here for sonohysterography due to irregular and heavy bleeding at times.  H/o planned endometrial ablation earlier this year but at the time of the procedure, larger submucosal fibroid was noted.  This was resected.  Bleeding did improve but she has experienced heavier bleeding over the last few months.  Is on daily progesterone.  Does not like how it makes her feel.    Unrelated, on 11/25, she has a CT of the abdomen/pelvis due to persistent GI symptoms.  5.7cm renal lesion not concerning for renal cell carcinoma.  No LAD noted.  She has appt with urology next week.    Sonohysterogram was performed showing submucosal fibroid.  As she is still having irregular bleeding, she is not a good candidate for HTA.  Could consider fibroid resection and then novasure ablation, if full resection occurs. Also could consider Sonata procedure with resection as well.  Will need to see if rep is available for procedure scheduled for 12/16.  Will also communicate with urology about any possible additional imaging that might be needed.     Past Medical History:  Diagnosis Date   Anxiety    Deviated nasal septum    Family history of adverse reaction to anesthesia    mom with PONV   Fibroids, submucosal 04/14/2018   several fibroids measuring 8 x 8mm, 2.3 x 2.3cm, 8 x 6mm, and 10 x 7mm   History of abnormal Pap smear 09/1991   LEEP, CIN I   History of spider veins    Left cataract    History of    Lipoma 03/24/2014   Left supraclavicular    MEDS:   Current Outpatient Medications on File Prior to Visit  Medication Sig Dispense Refill   Multiple Vitamin (MULTIVITAMIN) tablet Take 4 tablets by mouth daily. NutriFol     OVER THE COUNTER MEDICATION Take 1 drop by mouth daily as needed (alertness). CBD Oil     traZODone (DESYREL) 50 MG tablet Take 1 tablet (50 mg total) by mouth at bedtime. (Patient  taking differently: Take 50 mg by mouth at bedtime as needed for sleep.) 90 tablet 1   BIOTIN PO Take 1 tablet by mouth daily.     MAGNESIUM PO Take 2 tablets by mouth every evening.     norethindrone (AYGESTIN) 5 MG tablet Take 5 mg by mouth daily.     pantoprazole (PROTONIX) 40 MG tablet Take 40 mg by mouth every morning.     No current facility-administered medications on file prior to visit.    ALLERGIES: Nsaids and Topamax [topiramate]  SH:  single, non smoker  Review of Systems  Constitutional: Negative.   Genitourinary:        Irregular bleeding    PHYSICAL EXAMINATION:    BP (!) 138/90   Pulse 85   Ht 5\' 1"  (1.549 m)   Wt 144 lb (65.3 kg)   BMI 27.21 kg/m     General appearance: alert, cooperative and appears stated age Lymph:  no inguinal LAD noted  Pelvic: External genitalia:  no lesions              Urethra:  normal appearing urethra with no masses, tenderness or lesions              Bartholins and Skenes: normal  Vagina: normal mucosa without prolapse or lesions              Cervix: no lesions  Chaperone was present for exam.  Assessment/Plan: 1. Fibroids, submucosal - could consider hysteroscopic resection with possible Novasure or Sonata with resection of fibroid  2. History of anemia  3. History of menorrhagia - continue norethindrone 5mg  daily  4. DUB (dysfunctional uterine bleeding)  5. Renal mass - has urology appt next week already scheduled

## 2023-07-30 ENCOUNTER — Encounter (HOSPITAL_BASED_OUTPATIENT_CLINIC_OR_DEPARTMENT_OTHER): Payer: Self-pay | Admitting: Obstetrics & Gynecology

## 2023-07-30 DIAGNOSIS — C641 Malignant neoplasm of right kidney, except renal pelvis: Secondary | ICD-10-CM | POA: Diagnosis not present

## 2023-07-31 ENCOUNTER — Ambulatory Visit (HOSPITAL_BASED_OUTPATIENT_CLINIC_OR_DEPARTMENT_OTHER): Payer: 59 | Admitting: Obstetrics & Gynecology

## 2023-08-03 ENCOUNTER — Encounter (HOSPITAL_COMMUNITY): Admission: RE | Payer: Self-pay | Source: Ambulatory Visit

## 2023-08-03 ENCOUNTER — Ambulatory Visit (HOSPITAL_COMMUNITY): Admission: RE | Admit: 2023-08-03 | Payer: 59 | Source: Ambulatory Visit | Admitting: Obstetrics & Gynecology

## 2023-08-03 SURGERY — DILATATION & CURETTAGE/HYSTEROSCOPY WITH NOVASURE ABLATION
Anesthesia: Choice

## 2023-08-18 ENCOUNTER — Other Ambulatory Visit: Payer: Self-pay | Admitting: Urology

## 2023-08-26 ENCOUNTER — Ambulatory Visit (HOSPITAL_BASED_OUTPATIENT_CLINIC_OR_DEPARTMENT_OTHER): Payer: 59 | Admitting: Obstetrics & Gynecology

## 2023-08-26 ENCOUNTER — Other Ambulatory Visit: Payer: Self-pay | Admitting: Obstetrics & Gynecology

## 2023-08-26 DIAGNOSIS — Z1231 Encounter for screening mammogram for malignant neoplasm of breast: Secondary | ICD-10-CM

## 2023-09-08 NOTE — Progress Notes (Signed)
COVID Vaccine received:  []  No [x]  Yes Date of any COVID positive Test in last 90 days: no PCP - Evangeline Dakin PA- [facility] Cardiologist - no  Chest x-ray - 09/10/23 Epic EKG -   Stress Test -  ECHO -  Cardiac Cath -   Bowel Prep - [x]  No  []   Yes ______  Pacemaker / ICD device [x]  No []  Yes   Spinal Cord Stimulator:[x]  No []  Yes       History of Sleep Apnea? [x]  No []  Yes   CPAP used?- [x]  No []  Yes    Does the patient monitor blood sugar?          [x]  No []  Yes  []  N/A  Patient has: [x]  NO Hx DM   []  Pre-DM                 []  DM1  []   DM2 Does patient have a [employer] or Dexacom? []  No []  Yes   Fasting Blood Sugar Ranges-  Checks Blood Sugar _____ times a day  GLP1 agonist / usual dose - no GLP1 instructions:  SGLT-2 inhibitors / usual dose - no SGLT-2 instructions:   Blood Thinner / Instructions:no Aspirin Instructions:no  Comments:   Activity level: Patient is  able to climb a flight of stairs without difficulty; [x]  No CP  [x]  No SOB, ___   Patient can  perform ADLs without assistance.   Anesthesia review:   Patient denies shortness of breath, fever, cough and chest pain at PAT appointment.  Patient verbalized understanding and agreement to the Pre-Surgical Instructions that were given to them at this PAT appointment. Patient was also educated of the need to review these PAT instructions again prior to his/her surgery.I reviewed the appropriate phone numbers to call if they have any and questions or concerns.

## 2023-09-08 NOTE — Patient Instructions (Signed)
SURGICAL WAITING ROOM VISITATION  Patients having surgery or a procedure may have no more than 2 support people in the waiting area - these visitors may rotate.    Children under the age of 70 must have an adult with them who is not the patient.  Due to an increase in RSV and influenza rates and associated hospitalizations, children ages 39 and under may not visit patients in [facility] hospitals.  Visitors with respiratory illnesses are discouraged from visiting and should remain at home.  If the patient needs to stay at the hospital during part of their recovery, the visitor guidelines for inpatient rooms apply. Pre-op nurse will coordinate an appropriate time for 1 support person to accompany patient in pre-op.  This support person may not rotate.    Please refer to the [facility] website for the visitor guidelines for Inpatients (after your surgery is over and you are in a regular room).       Your procedure is scheduled on: 09/23/23   Report to [facility] Main Entrance    Report to admitting at 6:15 AM   Call this number if you have problems the morning of surgery [phone]   Do not eat food or drink liquids  :After Midnight.                                            FOLLOW BOWEL PREP AND ANY ADDITIONAL PRE OP INSTRUCTIONS YOU RECEIVED FROM YOUR SURGEON'S OFFICE!!!     Oral Hygiene is also important to reduce your risk of infection.                                    Remember - BRUSH YOUR TEETH THE MORNING OF SURGERY WITH YOUR REGULAR TOOTHPASTE   Stop all vitamins and herbal supplements 7 days before surgery.   Take these medicines the morning of surgery with A SIP OF WATER: Aygestin, Protonix             You may not have any metal on your body including hair pins, jewelry, and body piercing             Do not wear make-up, lotions, powders, perfumes/cologne, or deodorant  Do not wear nail polish including gel and S&S, artificial/acrylic nails, or any  other type of covering on natural nails including finger and toenails. If you have artificial nails, gel coating, etc. that needs to be removed by a nail salon please have this removed prior to surgery or surgery may need to be canceled/ delayed if the surgeon/ anesthesia feels like they are unable to be safely monitored.   Do not shave  48 hours prior to surgery.    Do not bring valuables to the hospital. [facility] IS NOT             RESPONSIBLE   FOR VALUABLES.   Contacts, glasses, dentures or bridgework may not be worn into surgery.   Bring small overnight bag day of surgery.   DO NOT BRING YOUR HOME MEDICATIONS TO THE HOSPITAL. PHARMACY WILL DISPENSE MEDICATIONS LISTED ON YOUR MEDICATION LIST TO YOU DURING YOUR ADMISSION IN THE HOSPITAL!    Patients discharged on the day of surgery will not be allowed to drive home.  Someone NEEDS to stay with  you for the first 24 hours after anesthesia.   Special Instructions: Bring a copy of your healthcare power of attorney and living will documents the day of surgery if you haven't scanned them before.              Please read over the following fact sheets you were given: IF YOU HAVE QUESTIONS ABOUT YOUR PRE-OP INSTRUCTIONS PLEASE CALL [phone] Rosey Bath   If you received a COVID test during your pre-op visit  it is requested that you wear a mask when out in public, stay away from anyone that may not be feeling well and notify your surgeon if you develop symptoms. If you test positive for Covid or have been in contact with anyone that has tested positive in the last 10 days please notify you surgeon.     - Preparing for Surgery Before surgery, you can play an important role.  Because skin is not sterile, your skin needs to be as free of germs as possible.  You can reduce the number of germs on your skin by washing with CHG (chlorahexidine gluconate) soap before surgery.  CHG is an antiseptic cleaner which kills germs and bonds with the  skin to continue killing germs even after washing. Please DO NOT use if you have an allergy to CHG or antibacterial soaps.  If your skin becomes reddened/irritated stop using the CHG and inform your nurse when you arrive at Short Stay. Do not shave (including legs and underarms) for at least 48 hours prior to the first CHG shower.  You may shave your face/neck.  Please follow these instructions carefully:  1.  Shower with CHG Soap the night before surgery and the  morning of surgery.  2.  If you choose to wash your hair, wash your hair first as usual with your normal  shampoo.  3.  After you shampoo, rinse your hair and body thoroughly to remove the shampoo.                             4.  Use CHG as you would any other liquid soap.  You can apply chg directly to the skin and wash.  Gently with a scrungie or clean washcloth.  5.  Apply the CHG Soap to your body ONLY FROM THE NECK DOWN.   Do   not use on face/ open                           Wound or open sores. Avoid contact with eyes, ears mouth and   genitals (private parts).                       Wash face,  Genitals (private parts) with your normal soap.             6.  Wash thoroughly, paying special attention to the area where your    surgery  will be performed.  7.  Thoroughly rinse your body with warm water from the neck down.  8.  DO NOT shower/wash with your normal soap after using and rinsing off the CHG Soap.                9.  Pat yourself dry with a clean towel.            10.  Wear clean pajamas.  11.  Place clean sheets on your bed the night of your first shower and do not  sleep with pets. Day of Surgery : Do not apply any lotions/deodorants the morning of surgery.  Please wear clean clothes to the hospital/surgery center.  FAILURE TO FOLLOW THESE INSTRUCTIONS MAY RESULT IN THE CANCELLATION OF YOUR SURGERY  PATIENT SIGNATURE_________________________________  NURSE  SIGNATURE__________________________________  ________________________________________________________________________

## 2023-09-10 ENCOUNTER — Encounter (HOSPITAL_COMMUNITY): Payer: Self-pay

## 2023-09-10 ENCOUNTER — Encounter (HOSPITAL_COMMUNITY)
Admission: RE | Admit: 2023-09-10 | Discharge: 2023-09-10 | Disposition: A | Discharge status: Home / Self Care | Payer: 59 | Source: Ambulatory Visit | Attending: Urology | Admitting: Urology

## 2023-09-10 ENCOUNTER — Other Ambulatory Visit: Payer: Self-pay

## 2023-09-10 ENCOUNTER — Ambulatory Visit (HOSPITAL_COMMUNITY)
Admission: RE | Admit: 2023-09-10 | Discharge: 2023-09-10 | Disposition: A | Discharge status: Home / Self Care | Payer: 59 | Source: Ambulatory Visit | Attending: Urology | Admitting: Urology

## 2023-09-10 VITALS — BP 151/99 | HR 79 | Temp 98.6°F | Resp 16 | Ht 60.0 in | Wt 148.0 lb

## 2023-09-10 DIAGNOSIS — Z01818 Encounter for other preprocedural examination: Secondary | ICD-10-CM | POA: Insufficient documentation

## 2023-09-10 DIAGNOSIS — Z862 Personal history of diseases of the blood and blood-forming organs and certain disorders involving the immune mechanism: Secondary | ICD-10-CM | POA: Insufficient documentation

## 2023-09-10 DIAGNOSIS — N2889 Other specified disorders of kidney and ureter: Secondary | ICD-10-CM | POA: Diagnosis not present

## 2023-09-10 LAB — BASIC METABOLIC PANEL
Anion gap: 7 (ref 5–15)
BUN: 15 mg/dL (ref 6–20)
CO2: 26 mmol/L (ref 22–32)
Calcium: 9.2 mg/dL (ref 8.9–10.3)
Chloride: 104 mmol/L (ref 98–111)
Creatinine, Ser: 0.9 mg/dL (ref 0.44–1.00)
GFR, Estimated: >60 mL/min (ref >60)
Glucose, Bld: 92 mg/dL (ref 70–99)
Potassium: 4.3 mmol/L (ref 3.5–5.1)
Sodium: 137 mmol/L (ref 135–145)

## 2023-09-10 LAB — CBC
HCT: 44.9 % (ref 36.0–46.0)
Hemoglobin: 14.9 g/dL (ref 12.0–15.0)
MCH: 30.8 pg (ref 26.0–34.0)
MCHC: 33.2 g/dL (ref 30.0–36.0)
MCV: 92.8 fL (ref 80.0–100.0)
Platelets: 213 10*3/uL (ref 150–400)
RBC: 4.84 MIL/uL (ref 3.87–5.11)
RDW: 12.7 % (ref 11.5–15.5)
WBC: 8.9 10*3/uL (ref 4.0–10.5)
nRBC: 0 % (ref 0.0–0.2)

## 2023-09-10 LAB — TYPE AND SCREEN
ABO/RH(D): A POS
Antibody Screen: NEGATIVE

## 2023-09-15 ENCOUNTER — Ambulatory Visit
Admission: RE | Admit: 2023-09-15 | Discharge: 2023-09-15 | Disposition: A | Discharge status: Home / Self Care | Payer: 59 | Source: Ambulatory Visit | Attending: Obstetrics & Gynecology | Admitting: Obstetrics & Gynecology

## 2023-09-15 DIAGNOSIS — Z1231 Encounter for screening mammogram for malignant neoplasm of breast: Secondary | ICD-10-CM

## 2023-09-23 ENCOUNTER — Other Ambulatory Visit: Payer: Self-pay

## 2023-09-23 ENCOUNTER — Encounter (HOSPITAL_COMMUNITY): Payer: Self-pay | Admitting: Urology

## 2023-09-23 ENCOUNTER — Inpatient Hospital Stay (HOSPITAL_COMMUNITY): Payer: 59 | Admitting: Certified Registered"

## 2023-09-23 ENCOUNTER — Encounter (HOSPITAL_COMMUNITY)
Admission: RE | Disposition: A | Discharge status: Home / Self Care | Payer: Self-pay | Source: Home / Self Care | Attending: Urology

## 2023-09-23 ENCOUNTER — Inpatient Hospital Stay (HOSPITAL_COMMUNITY)
Admission: RE | Admit: 2023-09-23 | Discharge: 2023-09-24 | DRG: 658 | Disposition: A | Discharge status: Home / Self Care | Payer: 59 | Attending: Urology | Admitting: Urology

## 2023-09-23 DIAGNOSIS — Z800 Family history of malignant neoplasm of digestive organs: Secondary | ICD-10-CM | POA: Diagnosis not present

## 2023-09-23 DIAGNOSIS — D3501 Benign neoplasm of right adrenal gland: Secondary | ICD-10-CM | POA: Diagnosis present

## 2023-09-23 DIAGNOSIS — F419 Anxiety disorder, unspecified: Secondary | ICD-10-CM | POA: Diagnosis not present

## 2023-09-23 DIAGNOSIS — Z793 Long term (current) use of hormonal contraceptives: Secondary | ICD-10-CM | POA: Diagnosis not present

## 2023-09-23 DIAGNOSIS — C641 Malignant neoplasm of right kidney, except renal pelvis: Secondary | ICD-10-CM | POA: Diagnosis not present

## 2023-09-23 DIAGNOSIS — Z87410 Personal history of cervical dysplasia: Secondary | ICD-10-CM | POA: Diagnosis not present

## 2023-09-23 DIAGNOSIS — K219 Gastro-esophageal reflux disease without esophagitis: Secondary | ICD-10-CM | POA: Diagnosis present

## 2023-09-23 DIAGNOSIS — Z886 Allergy status to analgesic agent status: Secondary | ICD-10-CM

## 2023-09-23 DIAGNOSIS — Z801 Family history of malignant neoplasm of trachea, bronchus and lung: Secondary | ICD-10-CM

## 2023-09-23 DIAGNOSIS — Z79899 Other long term (current) drug therapy: Secondary | ICD-10-CM | POA: Diagnosis not present

## 2023-09-23 DIAGNOSIS — N2889 Other specified disorders of kidney and ureter: Secondary | ICD-10-CM

## 2023-09-23 DIAGNOSIS — Z888 Allergy status to other drugs, medicaments and biological substances status: Secondary | ICD-10-CM

## 2023-09-23 DIAGNOSIS — D49511 Neoplasm of unspecified behavior of right kidney: Secondary | ICD-10-CM | POA: Diagnosis not present

## 2023-09-23 HISTORY — PX: ROBOT ASSISTED LAPAROSCOPIC NEPHRECTOMY: SHX5140

## 2023-09-23 LAB — HEMOGLOBIN AND HEMATOCRIT, BLOOD
HCT: 43.4 % (ref 36.0–46.0)
Hemoglobin: 14.8 g/dL (ref 12.0–15.0)

## 2023-09-23 LAB — POCT PREGNANCY, URINE: Preg Test, Ur: NEGATIVE

## 2023-09-23 SURGERY — NEPHRECTOMY, RADICAL, ROBOT-ASSISTED, LAPAROSCOPIC, ADULT
Anesthesia: General | Laterality: Right

## 2023-09-23 MED ORDER — OXYCODONE HCL 5 MG PO TABS: 5.0000 mg | ORAL_TABLET | Freq: Once | ORAL | Status: DC | PRN

## 2023-09-23 MED ORDER — HYDROCODONE-ACETAMINOPHEN 5-325 MG PO TABS: 1.0000 | ORAL_TABLET | Freq: Four times a day (QID) | ORAL | 0 refills | Status: DC | PRN

## 2023-09-23 MED ORDER — SODIUM CHLORIDE (PF) 0.9 % IJ SOLN
INTRAMUSCULAR | Status: DC | PRN
  Administered 2023-09-23: 20 mL via INTRAVENOUS

## 2023-09-23 MED ORDER — OXYCODONE HCL 5 MG PO TABS: 5.0000 mg | ORAL_TABLET | ORAL | Status: DC | PRN

## 2023-09-23 MED ORDER — CEFAZOLIN SODIUM-DEXTROSE 2-4 GM/100ML-% IV SOLN
2.0000 g | INTRAVENOUS | Status: AC
  Administered 2023-09-23: 2 g via INTRAVENOUS
  Filled 2023-09-23: qty 100

## 2023-09-23 MED ORDER — HYDROMORPHONE HCL 1 MG/ML IJ SOLN
0.2500 mg | INTRAMUSCULAR | Status: DC | PRN
  Administered 2023-09-23: 0.25 mg via INTRAVENOUS
  Administered 2023-09-23 (×3): 0.5 mg via INTRAVENOUS
  Administered 2023-09-23: 0.25 mg via INTRAVENOUS

## 2023-09-23 MED ORDER — MIDAZOLAM HCL 2 MG/2ML IJ SOLN
INTRAMUSCULAR | Status: DC | PRN
  Administered 2023-09-23: 2 mg via INTRAVENOUS

## 2023-09-23 MED ORDER — SODIUM CHLORIDE (PF) 0.9 % IJ SOLN
INTRAMUSCULAR | Status: AC
  Filled 2023-09-23: qty 20

## 2023-09-23 MED ORDER — ROCURONIUM BROMIDE 10 MG/ML (PF) SYRINGE
PREFILLED_SYRINGE | INTRAVENOUS | Status: DC | PRN
  Administered 2023-09-23: 20 mg via INTRAVENOUS
  Administered 2023-09-23: 70 mg via INTRAVENOUS

## 2023-09-23 MED ORDER — PROPOFOL 10 MG/ML IV BOLUS
INTRAVENOUS | Status: DC | PRN
  Administered 2023-09-23: 140 mg via INTRAVENOUS

## 2023-09-23 MED ORDER — ONDANSETRON HCL 4 MG/2ML IJ SOLN
4.0000 mg | Freq: Four times a day (QID) | INTRAMUSCULAR | Status: DC | PRN
  Administered 2023-09-23: 4 mg via INTRAVENOUS
  Filled 2023-09-23: qty 2

## 2023-09-23 MED ORDER — DEXAMETHASONE SODIUM PHOSPHATE 10 MG/ML IJ SOLN
INTRAMUSCULAR | Status: AC
  Filled 2023-09-23: qty 1

## 2023-09-23 MED ORDER — PHENYLEPHRINE 80 MCG/ML (10ML) SYRINGE FOR IV PUSH (FOR BLOOD PRESSURE SUPPORT)
PREFILLED_SYRINGE | INTRAVENOUS | Status: DC | PRN
  Administered 2023-09-23: 80 ug via INTRAVENOUS
  Administered 2023-09-23 (×2): 160 ug via INTRAVENOUS

## 2023-09-23 MED ORDER — DEXAMETHASONE SODIUM PHOSPHATE 10 MG/ML IJ SOLN
INTRAMUSCULAR | Status: DC | PRN
  Administered 2023-09-23: 4 mg via INTRAVENOUS

## 2023-09-23 MED ORDER — ONDANSETRON HCL 4 MG/2ML IJ SOLN
INTRAMUSCULAR | Status: DC | PRN
  Administered 2023-09-23: 4 mg via INTRAVENOUS

## 2023-09-23 MED ORDER — ONDANSETRON HCL 4 MG PO TABS: 4.0000 mg | ORAL_TABLET | Freq: Four times a day (QID) | ORAL | Status: DC | PRN

## 2023-09-23 MED ORDER — HYDROMORPHONE HCL 1 MG/ML IJ SOLN
INTRAMUSCULAR | Status: AC
  Filled 2023-09-23: qty 1

## 2023-09-23 MED ORDER — LIDOCAINE HCL (PF) 2 % IJ SOLN
INTRAMUSCULAR | Status: DC | PRN
  Administered 2023-09-23: 60 mg via INTRADERMAL

## 2023-09-23 MED ORDER — PROMETHAZINE (PHENERGAN) 6.25MG IN NS 50ML IVPB
6.2500 mg | Freq: Three times a day (TID) | INTRAVENOUS | Status: DC | PRN
  Administered 2023-09-23: 6.25 mg via INTRAVENOUS
  Filled 2023-09-23: qty 6.25

## 2023-09-23 MED ORDER — AMISULPRIDE (ANTIEMETIC) 5 MG/2ML IV SOLN: 10.0000 mg | Freq: Once | INTRAVENOUS | Status: DC | PRN

## 2023-09-23 MED ORDER — BUPIVACAINE LIPOSOME 1.3 % IJ SUSP
INTRAMUSCULAR | Status: AC
  Filled 2023-09-23: qty 20

## 2023-09-23 MED ORDER — ROCURONIUM BROMIDE 10 MG/ML (PF) SYRINGE
PREFILLED_SYRINGE | INTRAVENOUS | Status: AC
Start: 2023-09-23 — End: ?
  Filled 2023-09-23: qty 10

## 2023-09-23 MED ORDER — POTASSIUM CHLORIDE IN NACL 20-0.9 MEQ/L-% IV SOLN
INTRAVENOUS | Status: AC
  Filled 2023-09-23 (×2): qty 1000

## 2023-09-23 MED ORDER — FENTANYL CITRATE (PF) 100 MCG/2ML IJ SOLN
INTRAMUSCULAR | Status: AC
  Filled 2023-09-23: qty 2

## 2023-09-23 MED ORDER — CHLORHEXIDINE GLUCONATE CLOTH 2 % EX PADS
6.0000 | MEDICATED_PAD | Freq: Every day | CUTANEOUS | Status: DC
  Administered 2023-09-23: 6 via TOPICAL

## 2023-09-23 MED ORDER — SUGAMMADEX SODIUM 200 MG/2ML IV SOLN
INTRAVENOUS | Status: DC | PRN
  Administered 2023-09-23: 150 mg via INTRAVENOUS

## 2023-09-23 MED ORDER — ONDANSETRON HCL 4 MG/2ML IJ SOLN: 4.0000 mg | INTRAMUSCULAR | Status: DC | PRN

## 2023-09-23 MED ORDER — ONDANSETRON HCL 4 MG PO TABS
4.0000 mg | ORAL_TABLET | Freq: Three times a day (TID) | ORAL | Status: DC | PRN
  Administered 2023-09-23: 4 mg via ORAL
  Filled 2023-09-23 (×2): qty 1

## 2023-09-23 MED ORDER — FENTANYL CITRATE (PF) 100 MCG/2ML IJ SOLN
INTRAMUSCULAR | Status: DC | PRN
  Administered 2023-09-23 (×2): 50 ug via INTRAVENOUS
  Administered 2023-09-23: 100 ug via INTRAVENOUS

## 2023-09-23 MED ORDER — OXYCODONE HCL 5 MG/5ML PO SOLN: 5.0000 mg | Freq: Once | ORAL | Status: DC | PRN

## 2023-09-23 MED ORDER — LIDOCAINE HCL (PF) 2 % IJ SOLN
INTRAMUSCULAR | Status: AC
  Filled 2023-09-23: qty 5

## 2023-09-23 MED ORDER — CHLORHEXIDINE GLUCONATE 0.12 % MT SOLN
15.0000 mL | Freq: Once | OROMUCOSAL | Status: AC
  Administered 2023-09-23: 15 mL via OROMUCOSAL

## 2023-09-23 MED ORDER — HYDROMORPHONE HCL 1 MG/ML IJ SOLN: 0.5000 mg | INTRAMUSCULAR | Status: DC | PRN

## 2023-09-23 MED ORDER — BUPIVACAINE LIPOSOME 1.3 % IJ SUSP
INTRAMUSCULAR | Status: DC | PRN
  Administered 2023-09-23: 40 mL

## 2023-09-23 MED ORDER — LACTATED RINGERS IV SOLN
INTRAVENOUS | Status: DC
Start: 2023-09-23 — End: 2023-09-23

## 2023-09-23 MED ORDER — MIDAZOLAM HCL 2 MG/2ML IJ SOLN
INTRAMUSCULAR | Status: AC
  Filled 2023-09-23: qty 2

## 2023-09-23 MED ORDER — ORAL CARE MOUTH RINSE
15.0000 mL | Freq: Once | OROMUCOSAL | Status: AC
Start: 2023-09-23 — End: 2023-09-23

## 2023-09-23 MED ORDER — PROPOFOL 10 MG/ML IV BOLUS
INTRAVENOUS | Status: AC
  Filled 2023-09-23: qty 20

## 2023-09-23 MED ORDER — MEPERIDINE HCL 50 MG/ML IJ SOLN: 6.2500 mg | INTRAMUSCULAR | Status: DC | PRN

## 2023-09-23 MED ORDER — DOCUSATE SODIUM 100 MG PO CAPS: 100.0000 mg | ORAL_CAPSULE | Freq: Two times a day (BID) | ORAL | Status: DC

## 2023-09-23 MED ORDER — LIDOCAINE 2% (20 MG/ML) 5 ML SYRINGE: INTRAMUSCULAR | Status: DC | PRN

## 2023-09-23 MED ORDER — SODIUM CHLORIDE 0.9 % IV SOLN: 12.5000 mg | INTRAVENOUS | Status: DC | PRN

## 2023-09-23 MED ORDER — SENNOSIDES-DOCUSATE SODIUM 8.6-50 MG PO TABS
2.0000 | ORAL_TABLET | Freq: Every day | ORAL | Status: DC
  Administered 2023-09-23: 2 via ORAL
  Filled 2023-09-23: qty 2

## 2023-09-23 MED ORDER — ACETAMINOPHEN 500 MG PO TABS
1000.0000 mg | ORAL_TABLET | Freq: Four times a day (QID) | ORAL | Status: DC
  Administered 2023-09-23 – 2023-09-24 (×3): 1000 mg via ORAL
  Filled 2023-09-23 (×3): qty 2

## 2023-09-23 MED ORDER — ONDANSETRON HCL 4 MG/2ML IJ SOLN
INTRAMUSCULAR | Status: AC
  Filled 2023-09-23: qty 2

## 2023-09-23 SURGICAL SUPPLY — 59 items
BAG COUNTER SPONGE SURGICOUNT (BAG) ×1 IMPLANT
BAG LAPAROSCOPIC 12 15 PORT 16 (BASKET) ×1 IMPLANT
BAG RETRIEVAL 12/15 (BASKET) ×1
CHLORAPREP W/TINT 26 (MISCELLANEOUS) ×1 IMPLANT
CLIP LIGATING HEM O LOK PURPLE (MISCELLANEOUS) ×1 IMPLANT
CLIP LIGATING HEMO LOK XL GOLD (MISCELLANEOUS) ×1 IMPLANT
CLIP LIGATING HEMO O LOK GREEN (MISCELLANEOUS) ×1 IMPLANT
COVER SURGICAL LIGHT HANDLE (MISCELLANEOUS) ×1 IMPLANT
COVER TIP SHEARS 8 DVNC (MISCELLANEOUS) ×1 IMPLANT
CUTTER ECHEON FLEX ENDO 45 340 (ENDOMECHANICALS) IMPLANT
DERMABOND ADVANCED .7 DNX12 (GAUZE/BANDAGES/DRESSINGS) ×2 IMPLANT
DRAIN CHANNEL 15F RND FF 3/16 (WOUND CARE) IMPLANT
DRAPE ARM DVNC X/XI (DISPOSABLE) ×4 IMPLANT
DRAPE COLUMN DVNC XI (DISPOSABLE) ×1 IMPLANT
DRAPE INCISE IOBAN 66X45 STRL (DRAPES) ×1 IMPLANT
DRAPE SHEET LG 3/4 BI-LAMINATE (DRAPES) ×1 IMPLANT
DRIVER NDL LRG 8 DVNC XI (INSTRUMENTS) ×2 IMPLANT
DRIVER NDLE LRG 8 DVNC XI (INSTRUMENTS) ×2
ELECT PENCIL ROCKER SW 15FT (MISCELLANEOUS) ×1 IMPLANT
ELECT REM PT RETURN 15FT ADLT (MISCELLANEOUS) ×1 IMPLANT
EVACUATOR SILICONE 100CC (DRAIN) IMPLANT
FORCEPS BPLR FENES DVNC XI (FORCEP) ×1 IMPLANT
FORCEPS PROGRASP DVNC XI (FORCEP) ×1 IMPLANT
GLOVE BIO SURGEON STRL SZ 6.5 (GLOVE) ×1 IMPLANT
GLOVE SURG LX STRL 7.5 STRW (GLOVE) ×2 IMPLANT
GOWN STRL REUS W/ TWL XL LVL3 (GOWN DISPOSABLE) ×2 IMPLANT
GOWN STRL SURGICAL XL XLNG (GOWN DISPOSABLE) ×1 IMPLANT
HOLDER FOLEY CATH W/STRAP (MISCELLANEOUS) ×1 IMPLANT
IRRIG SUCT STRYKERFLOW 2 WTIP (MISCELLANEOUS) ×1
IRRIGATION SUCT STRKRFLW 2 WTP (MISCELLANEOUS) ×1 IMPLANT
KIT BASIN OR (CUSTOM PROCEDURE TRAY) ×1 IMPLANT
KIT TURNOVER KIT A (KITS) IMPLANT
LOOP VESSEL MAXI BLUE (MISCELLANEOUS) IMPLANT
NDL INSUFFLATION 14GA 120MM (NEEDLE) ×1 IMPLANT
NEEDLE INSUFFLATION 14GA 120MM (NEEDLE) ×1
PORT ACCESS TROCAR AIRSEAL 12 (TROCAR) ×1 IMPLANT
PROTECTOR NERVE ULNAR (MISCELLANEOUS) ×2 IMPLANT
RELOAD STAPLE 45 2.6 WHT THIN (STAPLE) IMPLANT
SCISSORS MNPLR CVD DVNC XI (INSTRUMENTS) ×1 IMPLANT
SEAL UNIV 5-12 XI (MISCELLANEOUS) ×4 IMPLANT
SET TRI-LUMEN FLTR TB AIRSEAL (TUBING) ×1 IMPLANT
SOL ELECTROSURG ANTI STICK (MISCELLANEOUS) ×1
SOLUTION ELECTROSURG ANTI STCK (MISCELLANEOUS) ×1 IMPLANT
SPIKE FLUID TRANSFER (MISCELLANEOUS) ×1 IMPLANT
SPONGE T-LAP 4X18 ~~LOC~~+RFID (SPONGE) IMPLANT
STAPLE RELOAD 45 WHT (STAPLE) ×3
STAPLER POWER ECHELON 45 WIDE (STAPLE) IMPLANT
SUT ETHILON 3 0 PS 1 (SUTURE) IMPLANT
SUT MNCRL AB 4-0 PS2 18 (SUTURE) ×2 IMPLANT
SUT NOVA NAB DX-16 0-1 5-0 T12 (SUTURE) IMPLANT
SUT PDS AB 1 CT1 27 (SUTURE) ×3 IMPLANT
SUT VIC AB 2-0 SH 27X BRD (SUTURE) ×1 IMPLANT
SUT VICRYL 0 UR6 27IN ABS (SUTURE) IMPLANT
TOWEL OR 17X26 10 PK STRL BLUE (TOWEL DISPOSABLE) ×1 IMPLANT
TRAY FOLEY MTR SLVR 16FR STAT (SET/KITS/TRAYS/PACK) ×1 IMPLANT
TRAY LAPAROSCOPIC (CUSTOM PROCEDURE TRAY) ×1 IMPLANT
TROCAR Z THREAD OPTICAL 12X100 (TROCAR) ×1 IMPLANT
TROCAR Z-THREAD OPTICAL 5X100M (TROCAR) IMPLANT
WATER STERILE IRR 1000ML POUR (IV SOLUTION) ×1 IMPLANT

## 2023-09-23 NOTE — Brief Op Note (Signed)
 09/23/2023  10:50 AM  PATIENT:  Sarah George  53 y.o. female  PRE-OPERATIVE DIAGNOSIS:  RIGHT RENAL MASS  POST-OPERATIVE DIAGNOSIS:  right renal mass  PROCEDURE:  Procedure(s): XI RIGHT ROBOTIC ASSISTED RADICAL LAPAROSCOPIC NEPHRECTOMY (Right)  SURGEON:  Surgeons and Role:    * Manny, Ricardo KATHEE Raddle., MD - Primary  PHYSICIAN ASSISTANT:   ASSISTANTS: Alan Hammonds PA   ANESTHESIA:   local and general  EBL:  50 mL   BLOOD ADMINISTERED:none  DRAINS:  foley to gravity    LOCAL MEDICATIONS USED:  MARCAINE      SPECIMEN:  Source of Specimen:  1 - Rt kidney with mass; 2 - Additional Rt adrenal tissue  DISPOSITION OF SPECIMEN:  PATHOLOGY  COUNTS:  YES  TOURNIQUET:  * No tourniquets in log *  DICTATION: .Other Dictation: Dictation Number [id number]  PLAN OF CARE: Admit to inpatient   PATIENT DISPOSITION:  PACU - hemodynamically stable.   Delay start of Pharmacological VTE agent (>24hrs) due to surgical blood loss or risk of bleeding: yes

## 2023-09-23 NOTE — Discharge Instructions (Addendum)
1- Drain Sites - You may have some mild persistent drainage from old drain site for several days, this is normal. This can be covered with cotton gauze for convenience.  2 - Stiches - Your stitches are all dissolvable. You may notice a "loose thread" at your incisions, these are normal and require no intervention. You may cut them flush to the skin with fingernail clippers if needed for comfort.  3 - Diet - No restrictions  4 - Activity - No heavy lifting / straining (any activities that require valsalva or "bearing down") x 4 weeks. Otherwise, no restrictions.  5 - Bathing - You may shower immediately. Do not take a bath or get into swimming pool where incision sites are submersed in water x 4 weeks.   6 -  When to Call the Doctor - Call MD for any fever >102, any acute wound problems, or any severe nausea / vomiting. You can call the [facility] [phone]) 24 hours a day 365 days a year. It will roll-over to the answering service and on-call physician after hours.  You may resume aspirin, advil, aleve, vitamins, and supplements 7 days after surgery.

## 2023-09-23 NOTE — Anesthesia Procedure Notes (Signed)
 Procedure Name: Intubation Date/Time: 09/23/2023 8:33 AM  Performed by: Brandy Almarie BROCKS, CRNAPre-anesthesia Checklist: Patient identified, Emergency Drugs available, Suction available and Patient being monitored Patient Re-evaluated:Patient Re-evaluated prior to induction Oxygen Delivery Method: Circle system utilized Preoxygenation: Pre-oxygenation with 100% oxygen Induction Type: IV induction Ventilation: Mask ventilation without difficulty Laryngoscope Size: Mac and 4 Grade View: Grade II Tube type: Oral Tube size: 7.0 mm Number of attempts: 2 Airway Equipment and Method: Bougie stylet Placement Confirmation: ETT inserted through vocal cords under direct vision, positive ETCO2 and breath sounds checked- equal and bilateral Secured at: 22 cm Tube secured with: Tape Dental Injury: Teeth and Oropharynx as per pre-operative assessment  Comments: DL by CRNA Grade 2b view. DL by Dr. Cleotilde, grade 2b view, used bougie to pass tube.

## 2023-09-23 NOTE — Anesthesia Postprocedure Evaluation (Signed)
 Anesthesia Post Note  Patient: Sarah George  Procedure(s) Performed: XI RIGHT ROBOTIC ASSISTED RADICAL LAPAROSCOPIC NEPHRECTOMY (Right)     Patient location during evaluation: PACU Anesthesia Type: General Level of consciousness: awake and alert Pain management: pain level controlled Vital Signs Assessment: post-procedure vital signs reviewed and stable Respiratory status: spontaneous breathing, nonlabored ventilation and respiratory function stable Cardiovascular status: blood pressure returned to baseline and stable Postop Assessment: no apparent nausea or vomiting Anesthetic complications: no   No notable events documented.  Last Vitals:  Vitals:   09/23/23 1145 09/23/23 1200  BP: 129/74 132/78  Pulse: 81 79  Resp: 10 10  Temp: 36.7 C   SpO2: 100% 100%    Last Pain:  Vitals:   09/23/23 1145  TempSrc:   PainSc: 3                  Butler Levander Pinal

## 2023-09-23 NOTE — Transfer of Care (Signed)
 Immediate Anesthesia Transfer of Care Note  Patient: Sarah George  Procedure(s) Performed: XI RIGHT ROBOTIC ASSISTED RADICAL LAPAROSCOPIC NEPHRECTOMY (Right)  Patient Location: PACU  Anesthesia Type:General  Level of Consciousness: awake, alert , and oriented  Airway & Oxygen Therapy: Patient Spontanous Breathing and Patient connected to face mask oxygen  Post-op Assessment: Report given to RN, Post -op Vital signs reviewed and stable, and Patient moving all extremities X 4  Post vital signs: Reviewed and stable  Last Vitals:  Vitals Value Taken Time  BP 124/99 09/23/23 1046  Temp    Pulse 102 09/23/23 1047  Resp 18 09/23/23 1047  SpO2 100 % 09/23/23 1047  Vitals shown include unfiled device data.  Last Pain:  Vitals:   09/23/23 0709  TempSrc:   PainSc: 0-No pain         Complications: No notable events documented.

## 2023-09-23 NOTE — H&P (Signed)
 Sarah George is an 53 y.o. female.    Chief Complaint: Pre-OP RIGHT Radical Nephrectomy  HPI:   1 - Right Renal Mass - 6cm Rt upper solid renal mass on CT late 2024 on eval epigastric pain. Mass is solitary, about 50% endophytic with dep aspect abutting sinus fat. 1 arteyr / 1 vein right renovascular anatomy. Cr 0.6, contralateral kideny unremarkable.   PMH sig for fibroid surgery, GERD. NO CV disease / blood thinners. 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 Her PCP is [place]  [facility] [employer].    Today  Nautia  is seen to proceed with RIGHT radical nephrectomy. Cr 0.9, Hgb 14.9 most recently.   Past Medical History:  Diagnosis Date   Anxiety    Deviated nasal septum    Family history of adverse reaction to anesthesia    mom with PONV   Fibroids, submucosal 04/14/2018   several fibroids measuring 8 x 8mm, 2.3 x 2.3cm, 8 x 6mm, and 10 x 7mm   History of abnormal Pap smear 09/1991   LEEP, CIN I   History of spider veins    Left cataract    History of    Lipoma 03/24/2014   Left supraclavicular    Past Surgical History:  Procedure Laterality Date   ABDOMINOPLASTY  07/17/2021   in [place]   CATARACT EXTRACTION Left 2012   DILATATION & CURETTAGE/HYSTEROSCOPY WITH MYOSURE N/A 05/03/2018   Procedure: DILATATION & CURETTAGE/HYSTEROSCOPY WITH MYOSURE;  Surgeon: Cleotilde Ronal RAMAN, MD;  Location: [facility];  Service: Gynecology;  Laterality: N/A;  Fibroid resection   DILITATION & CURRETTAGE/HYSTROSCOPY WITH HYDROTHERMAL ABLATION N/A 11/18/2022   Procedure: DILATATION & CURETTAGE/HYSTEROSCOPY WITH MYOSURE RESECTION OF FIBROID, ENDOMETRIAL BIOPSY.;  Surgeon: Cleotilde Ronal RAMAN, MD;  [facility];  Service: Gynecology;  Laterality: N/A;   LAPAROSCOPIC OVARIAN CYSTECTOMY Left 05/03/2018   Procedure: LAPAROSCOPIC OVARIAN CYSTECTOMY  possible LSO;  Surgeon: Cleotilde Ronal RAMAN, MD;  Location: [facility];  Service:  Gynecology;  Laterality: Left;  Possible LSO   LEEP  1993   CIN I   RHINOPLASTY  1997   Sclerotherapy  11/06/2015   Septorhinoplasty  01/23/2005   TONSILLECTOMY  06/2013    Family History  Problem Relation Age of Onset   Breast cancer Maternal Aunt    Breast cancer Cousin        maternal   Colon cancer Mother 30   Colon cancer Maternal Grandmother    Colon cancer Maternal Grandfather    Colon cancer Maternal Uncle    Lung cancer Paternal Grandfather    Social History:  reports that she has never smoked. She has never used smokeless tobacco. She reports current alcohol use. She reports that she does not use drugs.  Allergies:  Allergies  Allergen Reactions   Nsaids     Gastritis, possible ulcers    Topamax [Topiramate]     agitation and insomnia    Medications Prior to Admission  Medication Sig Dispense Refill   Ascorbic Acid (VITA-C PO) Take 180 mg by mouth daily. Liquid/ 2 ml     BIOTIN PO Take 2,500 mcg by mouth daily.     Cyanocobalamin  (VITAMIN B-12 PO) Take 5,000 mcg by mouth daily. Liquid drops     Magnesium 300 MG CAPS Take 600 mg by mouth every evening.     Multiple Vitamin (MULTIVITAMIN) tablet Take 4 tablets by mouth daily. NutriFol     norethindrone  (AYGESTIN )  5 MG tablet Take 5 mg by mouth daily.     OVER THE COUNTER MEDICATION Take 1 drop by mouth daily as needed (alertness). CBD Oil     pantoprazole (PROTONIX) 40 MG tablet Take 40 mg by mouth every morning.     TURMERIC PO Take 1 mL by mouth daily.     traZODone  (DESYREL ) 50 MG tablet Take 1 tablet (50 mg total) by mouth at bedtime. (Patient not taking: Reported on 09/04/2023) 90 tablet 1    No results found for this or any previous visit (from the past 48 hours). No results found.  Review of Systems  Constitutional:  Negative for chills and fever.  All other systems reviewed and are negative.   There were no vitals taken for this visit. Physical Exam Vitals reviewed.  HENT:     Head:  Normocephalic.  Eyes:     Pupils: Pupils are equal, round, and reactive to light.  Cardiovascular:     Rate and Rhythm: Normal rate.  Pulmonary:     Effort: Pulmonary effort is normal.  Abdominal:     General: Abdomen is flat.  Genitourinary:    Comments: No CVAT at present Musculoskeletal:        General: Normal range of motion.     Cervical back: Normal range of motion.  Skin:    General: Skin is warm.  Neurological:     General: No focal deficit present.     Mental Status: She is alert.  Psychiatric:        Mood and Affect: Mood normal.      Assessment/Plan  Proceed as planned with RIGHT robotic radical nephrectomy. Risks, benefits, alternatives, expected peri-op course discussed previously and reiterated today.   Ricardo KATHEE Alvaro Mickey., MD 09/23/2023, 6:28 AM

## 2023-09-23 NOTE — Anesthesia Preprocedure Evaluation (Signed)
 Anesthesia Evaluation  Patient identified by MRN, date of birth, ID band Patient awake    Reviewed: Allergy & Precautions, NPO status , Patient's Chart, lab work & pertinent test results  History of Anesthesia Complications (+) Family history of anesthesia reaction  Airway Mallampati: II  TM Distance: >3 FB Neck ROM: Full    Dental no notable dental hx.    Pulmonary neg pulmonary ROS   Pulmonary exam normal breath sounds clear to auscultation       Cardiovascular negative cardio ROS Normal cardiovascular exam Rhythm:Regular Rate:Normal     Neuro/Psych   Anxiety     negative neurological ROS     GI/Hepatic negative GI ROS, Neg liver ROS,,,  Endo/Other  negative endocrine ROS    Renal/GU negative Renal ROS  Female GU complaint     Musculoskeletal negative musculoskeletal ROS (+)    Abdominal Normal abdominal exam  (+)   Peds  Hematology negative hematology ROS (+)   Anesthesia Other Findings Renal Mass  Reproductive/Obstetrics                             Anesthesia Physical Anesthesia Plan  ASA: 3  Anesthesia Plan: General   Post-op Pain Management: Dilaudid  IV   Induction: Intravenous  PONV Risk Score and Plan: 3 and Ondansetron , Dexamethasone , Midazolam  and Treatment may vary due to age or medical condition  Airway Management Planned: Oral ETT  Additional Equipment: None  Intra-op Plan:   Post-operative Plan: Extubation in OR  Informed Consent: I have reviewed the patients History and Physical, chart, labs and discussed the procedure including the risks, benefits and alternatives for the proposed anesthesia with the patient or authorized representative who has indicated his/her understanding and acceptance.     Dental advisory given  Plan Discussed with: CRNA  Anesthesia Plan Comments:        Anesthesia Quick Evaluation

## 2023-09-24 ENCOUNTER — Encounter (HOSPITAL_COMMUNITY): Payer: Self-pay | Admitting: Urology

## 2023-09-24 LAB — BASIC METABOLIC PANEL
Anion gap: 13 (ref 5–15)
BUN: 12 mg/dL (ref 6–20)
CO2: 20 mmol/L — ABNORMAL LOW (ref 22–32)
Calcium: 8.7 mg/dL — ABNORMAL LOW (ref 8.9–10.3)
Chloride: 102 mmol/L (ref 98–111)
Creatinine, Ser: 1.2 mg/dL — ABNORMAL HIGH (ref 0.44–1.00)
GFR, Estimated: 55 mL/min — ABNORMAL LOW (ref >60)
Glucose, Bld: 90 mg/dL (ref 70–99)
Potassium: 4 mmol/L (ref 3.5–5.1)
Sodium: 135 mmol/L (ref 135–145)

## 2023-09-24 LAB — CBC
HCT: 43.9 % (ref 36.0–46.0)
Hemoglobin: 14.6 g/dL (ref 12.0–15.0)
MCH: 30.9 pg (ref 26.0–34.0)
MCHC: 33.3 g/dL (ref 30.0–36.0)
MCV: 93 fL (ref 80.0–100.0)
Platelets: 195 10*3/uL (ref 150–400)
RBC: 4.72 MIL/uL (ref 3.87–5.11)
RDW: 13.1 % (ref 11.5–15.5)
WBC: 17.3 10*3/uL — ABNORMAL HIGH (ref 4.0–10.5)
nRBC: 0 % (ref 0.0–0.2)

## 2023-09-24 NOTE — Discharge Summary (Signed)
 Date of admission: 09/23/2023  Date of discharge: 09/24/2023  Admission diagnosis:  Renal mass, right [N28.89]   Discharge diagnosis:  Renal mass, right [N28.89]  Secondary diagnoses:   Active Ambulatory Problems    Diagnosis Date Noted   DUB (dysfunctional uterine bleeding) 02/10/2014   Ovarian neoplasm 04/14/2018   Fibroids, submucosal 04/14/2018   Recurrent vaginitis 06/22/2021   H/O LEEP 06/22/2021   Intramural leiomyoma of uterus 06/22/2021   History of menorrhagia 11/18/2022   History of anemia 11/18/2022   Resolved Ambulatory Problems    Diagnosis Date Noted   Well woman exam with routine gynecological exam 01/13/2013   Past Medical History:  Diagnosis Date   Anxiety    Deviated nasal septum    Family history of adverse reaction to anesthesia    History of abnormal Pap smear 09/1991   History of spider veins    Left cataract    Lipoma 03/24/2014     History and Physical: For full details, please see admission history and physical. Briefly, Sarah George is a 53 y.o. year old patient who was admitted with right renal mass.   Hospital Course: Pt admitted and underwent right radical nephrectomy on 09/23/2023. Their hospital course was unremarkable. By POD1, they were tolerating a regular diet, voiding spontaneously, pain was controlled with oral medications, and they were deemed appropriate for discharge.   Their course was complicated by: None  On the day of discharge, the patient was tolerating a regular diet and their pain was well controlled. They were determined to be stable for discharge home  Laboratory values:  Recent Labs    09/23/23 1155 09/24/23 0603  HGB 14.8 14.6  HCT 43.4 43.9   Recent Labs    09/24/23 0603  CREATININE 1.20*    Disposition: Home  Discharge medications:  Allergies as of 09/24/2023       Reactions   Nsaids    Gastritis, possible ulcers    Topamax [topiramate]    agitation and insomnia        Medication List      STOP taking these medications    BIOTIN PO   Magnesium 300 MG Caps   multivitamin tablet   OVER THE COUNTER MEDICATION   TURMERIC PO   VITA-C PO   VITAMIN B-12 PO       TAKE these medications    docusate sodium  100 MG capsule Commonly known as: COLACE Take 1 capsule (100 mg total) by mouth 2 (two) times daily.   HYDROcodone -acetaminophen  5-325 MG tablet Commonly known as: Norco Take 1-2 tablets by mouth every 6 (six) hours as needed.   norethindrone  5 MG tablet Commonly known as: AYGESTIN  Take 5 mg by mouth daily.   pantoprazole 40 MG tablet Commonly known as: PROTONIX Take 40 mg by mouth every morning.   traZODone  50 MG tablet Commonly known as: DESYREL  Take 1 tablet (50 mg total) by mouth at bedtime.        Followup:   Follow-up Information     Alvaro Ricardo KATHEE Raddle., MD Follow up on 10/12/2023.   Specialty: Urology Why: at 12:30 for MD visit and pathology review. Contact information: [address] [place] [address] [phone]

## 2023-09-24 NOTE — Progress Notes (Signed)
   09/24/23 0927  TOC Brief Assessment  Insurance and Status Reviewed  Patient has primary care physician Yes  Home environment has been reviewed Resides alone  Prior level of function: Independent with ADLs at baseline  Prior/Current Home Services No current home services  Social Drivers of Health Review SDOH reviewed no interventions necessary  Readmission risk has been reviewed Yes  Transition of care needs no transition of care needs at this time

## 2023-09-25 LAB — SURGICAL PATHOLOGY

## 2023-09-28 NOTE — Op Note (Signed)
 NAME: Sarah George, FEESER MEDICAL RECORD NO: [id number] ACCOUNT NO: [id number] DATE OF BIRTH: Dec 18, 1970 FACILITY: [facility] LOCATION: [id number] PHYSICIAN: Ricardo Likens, MD  Operative Report   DATE OF PROCEDURE: 09/23/2023  PREOPERATIVE DIAGNOSIS:  Right renal mass.  POSTOPERATIVE DIAGNOSIS:  Right renal mass.  PROCEDURE PERFORMED:  Robotic-assisted laparoscopic right radical nephrectomy.  ESTIMATED BLOOD LOSS:  50 mL  COMPLICATIONS:  None.  SPECIMENS: 1.  Right kidney with mass. 2.  Additional right adrenal tissue.  FINDINGS: 1.  Prevascular solid heterogeneous right upper pole mass. 2.  Single artery, single vein, right renal vascular anatomy. 3.  Fullness without frank nodularity in the right adrenal gland.  This was resected.  ASSISTANT:  Alan Hammonds, PA  DRAIN:  Foley catheter for straight drain.  INDICATIONS:  The patient is a very pleasant 53 year old lady who was found incidentally on abdominal imaging to have a 6 cm right renal mass.  This was hypervascular with encroachment on the renal sinus. No obvious locally advanced or distant disease.   This is highly concerning for localized renal cancer.  Options were discussed including recommended path of right radical nephrectomy.  She presents for this today. Informed consent was obtained and placed in medical record.  DESCRIPTION OF PROCEDURE:  The patient was verified, procedure being right robotic radical nephrectomy was confirmed.  Procedure timeout was performed.  Intravenous antibiotics were administered.  General anesthesia was introduced.  The patient was  placed into a right side up, full plank position, pulling 15 degrees of table flexion, superior arm elevator, axillary roll, sequential compression devices, bottom leg bent, top leg straight.  Beanbag was deployed.  He was further fastened to the  operating table using 3-inch tape over foam padding across the supraxiphoid chest and pelvis.  A sterile field was created by  prepping and draping the patient's entire right flank and abdomen using chlorhexidine  gluconate.  A Foley catheter had been  placed before his lateral positioning.  Next, high flow, low pressure pneumoperitoneum was obtained using Veress technique in the right lower quadrant, having passed the aspiration and drop test.  An 8 mm robotic camera port was then placed in position  approximately one and half handbreadths superior lateral to umbilicus.  Laparoscopic visualization of the peritoneal cavity revealed no significant adhesions and no visceral injury.  The liver was somewhat large in relationship to her very petite  habitus, but otherwise abdomen was unremarkable.  Additional ports were placed as follows:  Right inferior paramedian robotic port approximately one handbreadth superior to the pubic ramus, right superior robotic port approximately one fingerbreadth  inferior to the costal margin, right far lateral 8 mm robotic port approximately four fingerbreadths superomedial to anterior iliac spine, a 12 mm AirSeal assistant port in the supraumbilical crease and a 5 mm port in a midline location approximately two  fingerbreadths superior to the plane of the camera port through which a laparoscopic grasper was used to very carefully elevate the liver edge, which allowed much better access and visualization towards the area of the kidney and mass.  Robot was docked  and passed the electronic checks.  Initial attention was directed at development of retroperitoneum.  Incision made lateral to the ascending colon in the area of the cecum towards the area of the hepatic flexure that was carefully mobilized medially.   The duodenum was encountered and required minimal kocherization medially such that its lateral border lie medial to the lateral surface of the inferior vena cava.  The lower pole of  the kidney was identified and placed on gentle lateral retraction.   Dissection proceeded medial to this. The ureter  was found in gonadal vessels, gonadal vessels were allowed to fall medially.  The ureter was swept laterally along with the kidney and this plane was developed towards the area of the renal hilum.  Renal  hilum consisted single artery, single vein, renal vascular anatomy as anticipated.  Artery was controlled using extra large Hem-o-lok clip proximal, vascular stapler distal.  The vein was carefully controlled using vascular stapler.  This revealed an  excellent hemostatic control of the hilum.  The mass was quite vascular and heterogeneous and tucked up under her already relatively large liver.  The kidney mass was placed in gentle lateral inferior tension to allow visualization in the medial plane,  which was carefully developed in a partial adrenal sparing fashion at this point.  Superior attachments were very carefully taken down using cautery scissors as were lateral attachments.  The ureter was doubly clipped and ligated.  This completely freed  up the large right kidney with mass specimen en bloc.  The area of the inferior liver edge, inferior vena cava area was inspected again. There was some residual adrenal tissue there that did have some fullness to it without frank nodularity and given the  right upper pole location of the mass, I clearly felt that resection is to be warranted for maximum oncologic benefit.  As such, maximal adrenal resection was performed clearing the plane just lateral to the inferior vena cava all the way to the  infrahepatic area, taking exquisite care to avoid any injury to the hepatic veins.  Superior attachments were carefully taken down using Hem-o-lok clips and this segment of additional adrenal tissue was removed, set aside for permanent pathology.   Following this, we achieved the goals of surgery today with excellent hemostasis.  All sponge and needle counts were correct.  Robot was then undocked.  Specimen was retrieved by extending the previous assistant port site  inferiorly, erring just to the  right lateral edge of her umbilicus in an effort to provide maximal individual cosmesis.  The nephrectomy specimen was removed, set aside for permanent pathology.  The patient with evidence of prior tummy tuck procedure.  Her abdominal wall was quite  tight from this.  I felt that she was at exquisite risk of hernias. Therefore, the fascia was closed using interrupted Novafil x8 followed by reapproximation of the scar with running Vicryl.  All incision sites were infiltrated with dilute lipolyzed  Marcaine  and closed at the level of the skin using subcuticular Monocryl followed by Dermabond.  Procedure was then terminated.  The patient tolerated the procedure well.  No immediate perioperative complications.  The patient taken to postanesthesia  care in stable condition and plan for inpatient admission.  Please note, first assistant Alan Hammonds was crucial for all portions of surgery today. She provided invaluable retraction, suctioning, vascular clipping, vascular stapling, robotic instrument exchange and general first assistance.   SHW D: 09/23/2023 10:58:29 am T: 09/23/2023 1:25:00 pm  JOB: [id number]/ [id number]

## 2023-10-05 ENCOUNTER — Encounter (HOSPITAL_BASED_OUTPATIENT_CLINIC_OR_DEPARTMENT_OTHER): Payer: Self-pay

## 2023-10-05 ENCOUNTER — Emergency Department (HOSPITAL_BASED_OUTPATIENT_CLINIC_OR_DEPARTMENT_OTHER): Payer: 59 | Admitting: Radiology

## 2023-10-05 ENCOUNTER — Emergency Department (HOSPITAL_BASED_OUTPATIENT_CLINIC_OR_DEPARTMENT_OTHER)
Admission: EM | Admit: 2023-10-05 | Discharge: 2023-10-06 | Disposition: A | Discharge status: Home / Self Care | Payer: 59 | Attending: Emergency Medicine | Admitting: Emergency Medicine

## 2023-10-05 ENCOUNTER — Emergency Department (HOSPITAL_BASED_OUTPATIENT_CLINIC_OR_DEPARTMENT_OTHER): Payer: 59

## 2023-10-05 DIAGNOSIS — Z79899 Other long term (current) drug therapy: Secondary | ICD-10-CM | POA: Insufficient documentation

## 2023-10-05 DIAGNOSIS — R0602 Shortness of breath: Secondary | ICD-10-CM | POA: Diagnosis not present

## 2023-10-05 DIAGNOSIS — R5383 Other fatigue: Secondary | ICD-10-CM | POA: Diagnosis not present

## 2023-10-05 DIAGNOSIS — Z905 Acquired absence of kidney: Secondary | ICD-10-CM | POA: Diagnosis not present

## 2023-10-05 DIAGNOSIS — J111 Influenza due to unidentified influenza virus with other respiratory manifestations: Secondary | ICD-10-CM | POA: Diagnosis not present

## 2023-10-05 DIAGNOSIS — J101 Influenza due to other identified influenza virus with other respiratory manifestations: Secondary | ICD-10-CM | POA: Diagnosis not present

## 2023-10-05 DIAGNOSIS — R21 Rash and other nonspecific skin eruption: Secondary | ICD-10-CM | POA: Diagnosis not present

## 2023-10-05 DIAGNOSIS — R188 Other ascites: Secondary | ICD-10-CM | POA: Diagnosis not present

## 2023-10-05 DIAGNOSIS — R051 Acute cough: Secondary | ICD-10-CM | POA: Diagnosis not present

## 2023-10-05 DIAGNOSIS — Z03818 Encounter for observation for suspected exposure to other biological agents ruled out: Secondary | ICD-10-CM | POA: Diagnosis not present

## 2023-10-05 LAB — BASIC METABOLIC PANEL
Anion gap: 9 (ref 5–15)
BUN: 16 mg/dL (ref 6–20)
CO2: 28 mmol/L (ref 22–32)
Calcium: 9.7 mg/dL (ref 8.9–10.3)
Chloride: 99 mmol/L (ref 98–111)
Creatinine, Ser: 1.41 mg/dL — ABNORMAL HIGH (ref 0.44–1.00)
GFR, Estimated: 45 mL/min — ABNORMAL LOW (ref >60)
Glucose, Bld: 102 mg/dL — ABNORMAL HIGH (ref 70–99)
Potassium: 3.7 mmol/L (ref 3.5–5.1)
Sodium: 136 mmol/L (ref 135–145)

## 2023-10-05 LAB — CBC
HCT: 43 % (ref 36.0–46.0)
Hemoglobin: 14.8 g/dL (ref 12.0–15.0)
MCH: 31 pg (ref 26.0–34.0)
MCHC: 34.4 g/dL (ref 30.0–36.0)
MCV: 90.1 fL (ref 80.0–100.0)
Platelets: 253 10*3/uL (ref 150–400)
RBC: 4.77 MIL/uL (ref 3.87–5.11)
RDW: 12.1 % (ref 11.5–15.5)
WBC: 9.2 10*3/uL (ref 4.0–10.5)
nRBC: 0 % (ref 0.0–0.2)

## 2023-10-05 LAB — D-DIMER, QUANTITATIVE: D-Dimer, Quant: 0.99 ug{FEU}/mL — ABNORMAL HIGH (ref 0.00–0.50)

## 2023-10-05 MED ORDER — SODIUM CHLORIDE 0.9 % IV BOLUS
500.0000 mL | Freq: Once | INTRAVENOUS | Status: AC
  Administered 2023-10-05: 500 mL via INTRAVENOUS

## 2023-10-05 MED ORDER — IOHEXOL 350 MG/ML SOLN
100.0000 mL | Freq: Once | INTRAVENOUS | Status: AC | PRN
  Administered 2023-10-05: 75 mL via INTRAVENOUS

## 2023-10-05 NOTE — ED Provider Notes (Signed)
[place] EMERGENCY DEPARTMENT AT [facility] Provider Note   CSN: [id number] Arrival date & time: 10/05/23  1954     History {Add pertinent medical, surgical, social history, OB history to HPI:1} Chief Complaint  Patient presents with   Shortness of Breath    Sarah George is a 53 y.o. female.  Patient complains of shortness of breath and and congestion.  Patient was seen at [facility] today and diagnosed with flu.  Patient had a nephrectomy on February 5.  Patient's primary care provider was concerned that she could have a pulmonary embolism and sent patient here for evaluation.  The history is provided by the patient. No language interpreter was used.  Shortness of Breath Severity:  Moderate Onset quality:  Gradual Timing:  Constant Progression:  Worsening Chronicity:  New Relieved by:  Nothing Worsened by:  Nothing Ineffective treatments:  None tried Associated symptoms: cough   Associated symptoms: no abdominal pain        Home Medications Prior to Admission medications   Medication Sig Start Date End Date Taking? Authorizing Provider  docusate sodium (COLACE) 100 MG capsule Take 1 capsule (100 mg total) by mouth 2 (two) times daily. 09/23/23   Harrie Foreman, PA-C  HYDROcodone-acetaminophen (NORCO) 5-325 MG tablet Take 1-2 tablets by mouth every 6 (six) hours as needed. 09/23/23   Harrie Foreman, PA-C  norethindrone (AYGESTIN) 5 MG tablet Take 5 mg by mouth daily.    [provider]  pantoprazole (PROTONIX) 40 MG tablet Take 40 mg by mouth every morning. 07/07/23   [provider]  traZODone (DESYREL) 50 MG tablet Take 1 tablet (50 mg total) by mouth at bedtime. Patient not taking: Reported on 09/04/2023 12/20/22   Jerene Bears, MD      Allergies    Nsaids and Topamax [topiramate]    Review of Systems   Review of Systems  Respiratory:  Positive for cough and shortness of breath.   Gastrointestinal:  Negative for abdominal pain.  All other  systems reviewed and are negative.   Physical Exam Updated Vital Signs BP (!) 173/107 (BP Location: Left Arm)   Pulse (!) 106   Temp 98.3 F (36.8 C) (Oral)   Resp 18   Ht 5' (1.524 m)   Wt 63.5 kg   LMP 10/02/2023 (Exact Date) Comment: negative urine preg as of 09/23/23 (patient takes progesterone and does not have periods)  SpO2 100%   BMI 27.34 kg/m  Physical Exam Vitals and nursing note reviewed.  Constitutional:      Appearance: She is well-developed.  HENT:     Head: Normocephalic.     Mouth/Throat:     Mouth: Mucous membranes are moist.  Eyes:     Pupils: Pupils are equal, round, and reactive to light.  Cardiovascular:     Rate and Rhythm: Normal rate and regular rhythm.  Pulmonary:     Effort: Pulmonary effort is normal.  Abdominal:     General: There is no distension.  Musculoskeletal:        General: Normal range of motion.     Cervical back: Normal range of motion.  Skin:    General: Skin is warm.  Neurological:     General: No focal deficit present.     Mental Status: She is alert and oriented to person, place, and time.     ED Results / Procedures / Treatments   Labs (all labs ordered are listed, but only abnormal results are displayed) Labs Reviewed  D-DIMER, QUANTITATIVE - Abnormal; Notable for the following components:      Result Value   D-Dimer, Quant 0.99 (*)    All other components within normal limits  BASIC METABOLIC PANEL - Abnormal; Notable for the following components:   Glucose, Bld 102 (*)    Creatinine, Ser 1.41 (*)    GFR, Estimated 45 (*)    All other components within normal limits  CBC    EKG None  Radiology DG Chest 2 View Result Date: 10/05/2023 CLINICAL DATA:  Shortness of breath chest congestion EXAM: CHEST - 2 VIEW COMPARISON:  09/10/2023 FINDINGS: The heart size and mediastinal contours are within normal limits. Both lungs are clear. The visualized skeletal structures are unremarkable. IMPRESSION: No active  cardiopulmonary disease. Electronically Signed   By: Jasmine Pang M.D.   On: 10/05/2023 21:23    Procedures Procedures  {Document cardiac monitor, telemetry assessment procedure when appropriate:1}  Medications Ordered in ED Medications - No data to display  ED Course/ Medical Decision Making/ A&P   {   Click here for ABCD2, HEART and other calculatorsREFRESH Note before signing :1}                              Medical Decision Making Patient complains of shortness of breath.  Patient diagnosed with flu today.  Amount and/or Complexity of Data Reviewed Labs: ordered. Decision-making details documented in ED Course. Radiology: ordered.     {Document critical care time when appropriate:1} {Document review of labs and clinical decision tools ie heart score, Chads2Vasc2 etc:1}  {Document your independent review of radiology images, and any outside records:1} {Document your discussion with family members, caretakers, and with consultants:1} {Document social determinants of health affecting pt's care:1} {Document your decision making why or why not admission, treatments were needed:1} Final Clinical Impression(s) / ED Diagnoses Final diagnoses:  None    Rx / DC Orders ED Discharge Orders     None

## 2023-10-05 NOTE — ED Notes (Signed)
 Return from CT

## 2023-10-05 NOTE — ED Triage Notes (Signed)
Pt arrived POV for SOB, chest congestion, headache, sore throat, dx with the Flu today at [facility], who also advised pt to come to ED for further evaluation of possible PE d/t SOB. Pt had a right nephrectomy 09/23/2023. A&O x4, Afebrile at current. Lungs clear bilaterally.

## 2023-10-06 NOTE — Discharge Instructions (Signed)
 Return if any problems.

## 2023-10-12 DIAGNOSIS — C641 Malignant neoplasm of right kidney, except renal pelvis: Secondary | ICD-10-CM | POA: Diagnosis not present

## 2023-10-13 ENCOUNTER — Encounter (HOSPITAL_BASED_OUTPATIENT_CLINIC_OR_DEPARTMENT_OTHER): Payer: Self-pay | Admitting: Obstetrics & Gynecology

## 2023-11-27 ENCOUNTER — Other Ambulatory Visit (HOSPITAL_BASED_OUTPATIENT_CLINIC_OR_DEPARTMENT_OTHER): Payer: Self-pay | Admitting: Obstetrics & Gynecology

## 2023-11-27 DIAGNOSIS — Z862 Personal history of diseases of the blood and blood-forming organs and certain disorders involving the immune mechanism: Secondary | ICD-10-CM

## 2023-11-27 DIAGNOSIS — D250 Submucous leiomyoma of uterus: Secondary | ICD-10-CM

## 2023-11-27 DIAGNOSIS — Z8742 Personal history of other diseases of the female genital tract: Secondary | ICD-10-CM

## 2023-12-02 ENCOUNTER — Telehealth: Payer: Self-pay

## 2023-12-02 NOTE — Telephone Encounter (Signed)
 I called patient to see if she was available for surgery on 12/15/23 w/ Dr. Annabell Key. I left a message asking patient to call me back to schedule at [phone]

## 2023-12-08 ENCOUNTER — Telehealth: Payer: Self-pay

## 2023-12-08 NOTE — Telephone Encounter (Signed)
 I called the patient to see if she was available for surgery w/ Dr. Annabell Key on 12/15/23. I left a voicemail asking patient to call me to confirm her availability.

## 2023-12-10 ENCOUNTER — Other Ambulatory Visit (HOSPITAL_BASED_OUTPATIENT_CLINIC_OR_DEPARTMENT_OTHER): Payer: Self-pay | Admitting: Obstetrics & Gynecology

## 2023-12-10 DIAGNOSIS — Z8742 Personal history of other diseases of the female genital tract: Secondary | ICD-10-CM

## 2023-12-10 NOTE — Telephone Encounter (Signed)
 Attempted to call no answer. Left vm.

## 2023-12-11 NOTE — Telephone Encounter (Signed)
LMOVM for pt to call regarding refill request

## 2023-12-21 ENCOUNTER — Encounter (HOSPITAL_BASED_OUTPATIENT_CLINIC_OR_DEPARTMENT_OTHER): Payer: Self-pay | Admitting: Obstetrics & Gynecology

## 2023-12-21 ENCOUNTER — Other Ambulatory Visit (HOSPITAL_BASED_OUTPATIENT_CLINIC_OR_DEPARTMENT_OTHER): Payer: Self-pay | Admitting: Obstetrics & Gynecology

## 2023-12-21 ENCOUNTER — Ambulatory Visit (INDEPENDENT_AMBULATORY_CARE_PROVIDER_SITE_OTHER): Admitting: Obstetrics & Gynecology

## 2023-12-21 ENCOUNTER — Other Ambulatory Visit (HOSPITAL_COMMUNITY)
Admission: RE | Admit: 2023-12-21 | Discharge: 2023-12-21 | Disposition: A | Discharge status: Home / Self Care | Source: Ambulatory Visit | Attending: Obstetrics & Gynecology | Admitting: Obstetrics & Gynecology

## 2023-12-21 VITALS — Ht 60.0 in | Wt 144.0 lb

## 2023-12-21 DIAGNOSIS — Z01419 Encounter for gynecological examination (general) (routine) without abnormal findings: Secondary | ICD-10-CM

## 2023-12-21 DIAGNOSIS — D250 Submucous leiomyoma of uterus: Secondary | ICD-10-CM | POA: Diagnosis not present

## 2023-12-21 DIAGNOSIS — Z800 Family history of malignant neoplasm of digestive organs: Secondary | ICD-10-CM

## 2023-12-21 DIAGNOSIS — Z803 Family history of malignant neoplasm of breast: Secondary | ICD-10-CM

## 2023-12-21 DIAGNOSIS — C641 Malignant neoplasm of right kidney, except renal pelvis: Secondary | ICD-10-CM

## 2023-12-21 DIAGNOSIS — Z862 Personal history of diseases of the blood and blood-forming organs and certain disorders involving the immune mechanism: Secondary | ICD-10-CM | POA: Diagnosis not present

## 2023-12-21 DIAGNOSIS — Z8742 Personal history of other diseases of the female genital tract: Secondary | ICD-10-CM | POA: Diagnosis not present

## 2023-12-21 DIAGNOSIS — Z124 Encounter for screening for malignant neoplasm of cervix: Secondary | ICD-10-CM

## 2023-12-21 DIAGNOSIS — Z9889 Other specified postprocedural states: Secondary | ICD-10-CM

## 2023-12-21 NOTE — Progress Notes (Unsigned)
   ANNUAL EXAM Patient name: Sarah George MRN [id number]  Date of birth: 12/09/1970 Chief Complaint:   Annual Exam  History of Present Illness:   Sarah George is a 53 y.o. G0P0000 Caucasian female being seen today for a routine annual exam.    No LMP recorded. (Menstrual status: Irregular Periods).   The pregnancy intention screening data noted above was reviewed. Potential methods of contraception were discussed. The patient elected to proceed with No data recorded.   Last pap 06/01/2020. Results were: NILM w/ HRHPV negative. H/O abnormal pap: h/o LEEP Last mammogram: 09/15/2023 . Results were: normal. Family h/o breast cancer: yes cousin and aunt Last colonoscopy: Has seen [facility].  Colonoscopy recommended this year.  Has stool same done in office     05/26/2023    3:44 PM 12/31/2022   10:39 AM 07/04/2022   11:43 AM 06/19/2021    2:54 PM  Depression screen PHQ 2/9  Decreased Interest 0 0 0 0  Down, Depressed, Hopeless 0 0 0 1  PHQ - 2 Score 0 0 0 1     Review of Systems:   Pertinent items are noted in HPI  Denies any urinary issues, denies bowel changes Pertinent History Reviewed:  Reviewed past medical,surgical, social and family history.  Reviewed problem list, medications and allergies. Physical Assessment:   Vitals:   12/21/23 1102  Weight: 144 lb (65.3 kg)  Height: 5' (1.524 m)  Body mass index is 28.12 kg/m.        Physical Examination:   General appearance - well appearing, and in no distress  Mental status - alert, oriented to person, place, and time  Psych:  She has a normal mood and affect  Skin - warm and dry, normal color, no suspicious lesions noted  Chest - effort normal, all lung fields clear to auscultation bilaterally  Heart - normal rate and regular rhythm  Neck:  midline trachea, no thyromegaly or nodules  Breasts - breasts appear normal, no suspicious masses, no skin or nipple changes or  axillary nodes  Abdomen - soft, nontender,  nondistended, no masses or organomegaly  Pelvic - VULVA: normal appearing vulva with no masses, tenderness or lesions    VAGINA: normal appearing vagina with normal color and discharge, no lesions    CERVIX: normal appearing cervix without discharge or lesions, no CMT  Thin prep pap is done with HR HPV cotesting  UTERUS: uterus is felt to be normal size, shape, consistency and nontender   ADNEXA: No adnexal masses or tenderness noted.  Rectal - normal rectal, good sphincter tone, no masses felt.   Extremities:  No swelling or varicosities noted  Chaperone present for exam  Assessment & Plan:    Orders Placed This Encounter  Procedures   Ambulatory referral to Genetics    Meds: No orders of the defined types were placed in this encounter.   Follow-up: No follow-ups on file.  Lillian Rein, MD 12/21/2023 11:46 AM

## 2023-12-23 ENCOUNTER — Encounter (HOSPITAL_BASED_OUTPATIENT_CLINIC_OR_DEPARTMENT_OTHER): Payer: Self-pay | Admitting: Obstetrics & Gynecology

## 2023-12-23 LAB — CYTOLOGY - PAP
Comment: NEGATIVE
Diagnosis: NEGATIVE
Diagnosis: REACTIVE
High risk HPV: NEGATIVE

## 2023-12-24 ENCOUNTER — Telehealth: Payer: Self-pay

## 2023-12-24 DIAGNOSIS — Z905 Acquired absence of kidney: Secondary | ICD-10-CM | POA: Insufficient documentation

## 2023-12-24 NOTE — Telephone Encounter (Signed)
 Patient left a voicemail confirming she is available for surgery w/ Dr. Annabell Key on 02/01/24 at 2 pm, and will arrive at [facility] at 12:00 pm. Pre-Op instructions and surgery details will be sent to patient's Mychart.

## 2023-12-24 NOTE — Telephone Encounter (Signed)
 I reached out to patient to see if she was available for surgery on 02/01/24 w/ Dr. Annabell Key at 12:15 pm. I left a detailed voicemail asking patient to call me to schedule at [phone].

## 2023-12-28 ENCOUNTER — Encounter (HOSPITAL_BASED_OUTPATIENT_CLINIC_OR_DEPARTMENT_OTHER): Payer: Self-pay

## 2023-12-30 ENCOUNTER — Encounter (HOSPITAL_BASED_OUTPATIENT_CLINIC_OR_DEPARTMENT_OTHER): Payer: Self-pay | Admitting: Obstetrics & Gynecology

## 2024-01-18 ENCOUNTER — Other Ambulatory Visit (HOSPITAL_BASED_OUTPATIENT_CLINIC_OR_DEPARTMENT_OTHER): Payer: Self-pay | Admitting: Obstetrics & Gynecology

## 2024-01-18 MED ORDER — MISOPROSTOL 200 MCG PO TABS: ORAL_TABLET | ORAL | 0 refills | Status: DC

## 2024-01-20 ENCOUNTER — Ambulatory Visit (HOSPITAL_BASED_OUTPATIENT_CLINIC_OR_DEPARTMENT_OTHER): Admitting: Obstetrics & Gynecology

## 2024-01-20 ENCOUNTER — Encounter (HOSPITAL_BASED_OUTPATIENT_CLINIC_OR_DEPARTMENT_OTHER): Payer: Self-pay | Admitting: Obstetrics & Gynecology

## 2024-01-20 ENCOUNTER — Other Ambulatory Visit (HOSPITAL_COMMUNITY)
Admission: RE | Admit: 2024-01-20 | Discharge: 2024-01-20 | Disposition: A | Discharge status: Home / Self Care | Source: Ambulatory Visit | Attending: Obstetrics & Gynecology | Admitting: Obstetrics & Gynecology

## 2024-01-20 VITALS — BP 115/75 | HR 90 | Ht 61.0 in | Wt 143.0 lb

## 2024-01-20 DIAGNOSIS — N920 Excessive and frequent menstruation with regular cycle: Secondary | ICD-10-CM

## 2024-01-20 DIAGNOSIS — Z8742 Personal history of other diseases of the female genital tract: Secondary | ICD-10-CM

## 2024-01-20 NOTE — Progress Notes (Signed)
 GYNECOLOGY  VISIT  CC:   endometrial biopsy  HPI: 53 y.o. G0P0000 Single White or Caucasian female here for pre op endometrial biopsy.  This is required by her insurance for coverage of procedure.     Past Medical History:  Diagnosis Date   Anxiety    Deviated nasal septum    Family history of adverse reaction to anesthesia    mom with PONV   Fibroids, submucosal 04/14/2018   several fibroids measuring 8 x 8mm, 2.3 x 2.3cm, 8 x 6mm, and 10 x 7mm   History of abnormal Pap smear 09/1991   LEEP, CIN I   History of spider veins    Left cataract    History of    Lipoma 03/24/2014   Left supraclavicular    MEDS:   Current Outpatient Medications on File Prior to Visit  Medication Sig Dispense Refill   misoprostol  (CYTOTEC ) 200 MCG tablet Place one tablet in buccal location pm and am before procedure 2 tablet 0   norethindrone  (MICRONOR ) 0.35 MG tablet TAKE 1 TABLET BY MOUTH EVERY DAY 84 tablet 1   pantoprazole (PROTONIX) 40 MG tablet Take 40 mg by mouth every morning.     No current facility-administered medications on file prior to visit.    ALLERGIES: Nsaids and Topamax [topiramate]  SH:  single, non smoker  Review of Systems  Constitutional: Negative.   Genitourinary:        Menorrhagia    PHYSICAL EXAMINATION:    BP (!) 137/101 (BP Location: Right Arm, Patient Position: Sitting)   Pulse 90   Ht 5\' 1"  (1.549 m)   Wt 143 lb (64.9 kg)   BMI 27.02 kg/m     General appearance: alert, cooperative and appears stated age  Lymph:  no inguinal LAD noted Pelvic: External genitalia:  no lesions              Urethra:  normal appearing urethra with no masses, tenderness or lesions              Bartholins and Skenes: normal                 Vagina: normal mucosa without prolapse or lesions              Cervix: no lesions  Endometrial biopsy recommended.  Discussed with patient.  Verbal and written consent obtained.   Procedure:  Speculum placed.  Cervix visualized and  cleansed with betadine  prep.  A single toothed tenaculum was applied to the anterior lip of the cervix.  Endometrial pipelle was advanced through the cervix into the endometrial cavity without difficulty.  Pipelle passed to 8cm.  Suction applied and pipelle removed with good tissue sample obtained.  Tenculum removed.  No bleeding noted.  Patient tolerated procedure well.  Assessment/Plan: 1. History of menorrhagia (Primary) - Surgical pathology( [place]/ [facility])

## 2024-01-21 LAB — SURGICAL PATHOLOGY

## 2024-01-28 ENCOUNTER — Ambulatory Visit (HOSPITAL_BASED_OUTPATIENT_CLINIC_OR_DEPARTMENT_OTHER): Payer: Self-pay | Admitting: Obstetrics & Gynecology

## 2024-01-28 ENCOUNTER — Other Ambulatory Visit: Payer: Self-pay

## 2024-01-28 ENCOUNTER — Encounter (HOSPITAL_BASED_OUTPATIENT_CLINIC_OR_DEPARTMENT_OTHER): Payer: Self-pay | Admitting: Obstetrics & Gynecology

## 2024-01-28 ENCOUNTER — Other Ambulatory Visit (HOSPITAL_BASED_OUTPATIENT_CLINIC_OR_DEPARTMENT_OTHER): Payer: Self-pay | Admitting: Obstetrics & Gynecology

## 2024-01-28 DIAGNOSIS — Z01818 Encounter for other preprocedural examination: Secondary | ICD-10-CM

## 2024-01-28 NOTE — Progress Notes (Signed)
 SDW CALL  Patient was given pre-op instructions over the phone. The opportunity was given for the patient to ask questions. No further questions asked. Patient verbalized understanding of instructions given.   PCP - Annabell Key, [place]  E, PA  Cardiologist -   PPM/ICD -  Device Orders -  Rep Notified -   Chest x-ray - 09-13-23 EKG - denies Stress Test - denies ECHO - denies Cardiac Cath - denies  Sleep Study - denies CPAP - n/a  DM -denies  Blood Thinner Instructions: denies Aspirin Instructions:n/a  ERAS Protcol - clear liquids until 11:15 am   COVID TEST- n/a   Anesthesia review: no  Patient denies shortness of breath, fever, cough and chest pain over the phone call   All instructions explained to the patient, with a verbal understanding of the material. Patient agrees to go over the instructions while at home for a better understanding.

## 2024-02-01 ENCOUNTER — Encounter (HOSPITAL_COMMUNITY)
Admission: RE | Disposition: A | Discharge status: Home / Self Care | Payer: Self-pay | Source: Ambulatory Visit | Attending: Obstetrics & Gynecology

## 2024-02-01 ENCOUNTER — Other Ambulatory Visit: Payer: Self-pay

## 2024-02-01 ENCOUNTER — Other Ambulatory Visit (HOSPITAL_COMMUNITY): Payer: Self-pay

## 2024-02-01 ENCOUNTER — Ambulatory Visit (HOSPITAL_COMMUNITY)

## 2024-02-01 ENCOUNTER — Ambulatory Visit (HOSPITAL_COMMUNITY)
Admission: RE | Admit: 2024-02-01 | Discharge: 2024-02-01 | Disposition: A | Discharge status: Home / Self Care | Source: Ambulatory Visit | Attending: Obstetrics & Gynecology | Admitting: Obstetrics & Gynecology

## 2024-02-01 ENCOUNTER — Other Ambulatory Visit (HOSPITAL_BASED_OUTPATIENT_CLINIC_OR_DEPARTMENT_OTHER): Payer: Self-pay | Admitting: Obstetrics & Gynecology

## 2024-02-01 ENCOUNTER — Encounter (HOSPITAL_COMMUNITY): Payer: Self-pay | Admitting: Obstetrics & Gynecology

## 2024-02-01 DIAGNOSIS — N920 Excessive and frequent menstruation with regular cycle: Secondary | ICD-10-CM | POA: Diagnosis not present

## 2024-02-01 DIAGNOSIS — K219 Gastro-esophageal reflux disease without esophagitis: Secondary | ICD-10-CM | POA: Insufficient documentation

## 2024-02-01 DIAGNOSIS — Z79899 Other long term (current) drug therapy: Secondary | ICD-10-CM | POA: Insufficient documentation

## 2024-02-01 DIAGNOSIS — Z905 Acquired absence of kidney: Secondary | ICD-10-CM | POA: Diagnosis not present

## 2024-02-01 DIAGNOSIS — D250 Submucous leiomyoma of uterus: Secondary | ICD-10-CM | POA: Insufficient documentation

## 2024-02-01 DIAGNOSIS — Z01818 Encounter for other preprocedural examination: Secondary | ICD-10-CM

## 2024-02-01 HISTORY — DX: Nausea with vomiting, unspecified: R11.2

## 2024-02-01 HISTORY — DX: Gastro-esophageal reflux disease without esophagitis: K21.9

## 2024-02-01 HISTORY — DX: Anemia, unspecified: D64.9

## 2024-02-01 HISTORY — DX: Other specified postprocedural states: Z98.890

## 2024-02-01 HISTORY — PX: HYSTEROSCOPY WITH D & C: SHX1775

## 2024-02-01 LAB — TYPE AND SCREEN
ABO/RH(D): A POS
Antibody Screen: NEGATIVE

## 2024-02-01 LAB — CBC
HCT: 47.9 % — ABNORMAL HIGH (ref 36.0–46.0)
Hemoglobin: 16 g/dL — ABNORMAL HIGH (ref 12.0–15.0)
MCH: 31.4 pg (ref 26.0–34.0)
MCHC: 33.4 g/dL (ref 30.0–36.0)
MCV: 93.9 fL (ref 80.0–100.0)
Platelets: 195 10*3/uL (ref 150–400)
RBC: 5.1 MIL/uL (ref 3.87–5.11)
RDW: 12.1 % (ref 11.5–15.5)
WBC: 9.6 10*3/uL (ref 4.0–10.5)
nRBC: 0 % (ref 0.0–0.2)

## 2024-02-01 LAB — POCT PREGNANCY, URINE: Preg Test, Ur: NEGATIVE

## 2024-02-01 SURGERY — DILATATION AND CURETTAGE /HYSTEROSCOPY
Anesthesia: General

## 2024-02-01 MED ORDER — LIDOCAINE-EPINEPHRINE 1 %-1:100000 IJ SOLN
INTRAMUSCULAR | Status: DC | PRN
  Administered 2024-02-01: 10 mL

## 2024-02-01 MED ORDER — GABAPENTIN 100 MG PO CAPS
100.0000 mg | ORAL_CAPSULE | Freq: Three times a day (TID) | ORAL | 0 refills | Status: DC | PRN
  Filled 2024-02-01: qty 20, 7d supply, fill #0

## 2024-02-01 MED ORDER — ROCURONIUM BROMIDE 10 MG/ML (PF) SYRINGE
PREFILLED_SYRINGE | INTRAVENOUS | Status: AC
Start: 2024-02-01 — End: 2024-02-01
  Filled 2024-02-01: qty 10

## 2024-02-01 MED ORDER — ACETAMINOPHEN 500 MG PO TABS
ORAL_TABLET | ORAL | Status: AC
  Administered 2024-02-01: 1000 mg via ORAL
  Filled 2024-02-01: qty 2

## 2024-02-01 MED ORDER — PROPOFOL 500 MG/50ML IV EMUL: INTRAVENOUS | Status: DC | PRN

## 2024-02-01 MED ORDER — ORAL CARE MOUTH RINSE: 15.0000 mL | Freq: Once | OROMUCOSAL | Status: AC

## 2024-02-01 MED ORDER — SCOPOLAMINE 1 MG/3DAYS TD PT72
MEDICATED_PATCH | TRANSDERMAL | Status: DC | PRN
  Administered 2024-02-01: 1 via TRANSDERMAL

## 2024-02-01 MED ORDER — CHLORHEXIDINE GLUCONATE 0.12 % MT SOLN
OROMUCOSAL | Status: AC
  Administered 2024-02-01: 15 mL via OROMUCOSAL
  Filled 2024-02-01: qty 15

## 2024-02-01 MED ORDER — HYDROCODONE-ACETAMINOPHEN 5-325 MG PO TABS
1.0000 | ORAL_TABLET | Freq: Four times a day (QID) | ORAL | 0 refills | Status: DC | PRN
  Filled 2024-02-01: qty 15, 2d supply, fill #0

## 2024-02-01 MED ORDER — PROPOFOL 10 MG/ML IV BOLUS
INTRAVENOUS | Status: DC | PRN
  Administered 2024-02-01: 200 mg via INTRAVENOUS

## 2024-02-01 MED ORDER — LIDOCAINE-EPINEPHRINE 1 %-1:100000 IJ SOLN
INTRAMUSCULAR | Status: AC
  Filled 2024-02-01: qty 2

## 2024-02-01 MED ORDER — ONDANSETRON HCL 4 MG/2ML IJ SOLN
INTRAMUSCULAR | Status: DC | PRN
  Administered 2024-02-01: 4 mg via INTRAVENOUS

## 2024-02-01 MED ORDER — LACTATED RINGERS IV SOLN: INTRAVENOUS | Status: DC

## 2024-02-01 MED ORDER — DEXAMETHASONE SODIUM PHOSPHATE 10 MG/ML IJ SOLN
INTRAMUSCULAR | Status: DC | PRN
  Administered 2024-02-01: 10 mg via INTRAVENOUS

## 2024-02-01 MED ORDER — PROPOFOL 10 MG/ML IV BOLUS
INTRAVENOUS | Status: AC
  Filled 2024-02-01: qty 20

## 2024-02-01 MED ORDER — FENTANYL CITRATE (PF) 250 MCG/5ML IJ SOLN
INTRAMUSCULAR | Status: DC | PRN
  Administered 2024-02-01 (×2): 50 ug via INTRAVENOUS

## 2024-02-01 MED ORDER — ACETAMINOPHEN 500 MG PO TABS: 1000.0000 mg | ORAL_TABLET | ORAL | Status: AC

## 2024-02-01 MED ORDER — DEXAMETHASONE SODIUM PHOSPHATE 10 MG/ML IJ SOLN
INTRAMUSCULAR | Status: AC
  Filled 2024-02-01: qty 1

## 2024-02-01 MED ORDER — MIDAZOLAM HCL 2 MG/2ML IJ SOLN
INTRAMUSCULAR | Status: AC
  Filled 2024-02-01: qty 2

## 2024-02-01 MED ORDER — CHLORHEXIDINE GLUCONATE 0.12 % MT SOLN: 15.0000 mL | Freq: Once | OROMUCOSAL | Status: AC

## 2024-02-01 MED ORDER — FENTANYL CITRATE (PF) 250 MCG/5ML IJ SOLN
INTRAMUSCULAR | Status: AC
  Filled 2024-02-01: qty 5

## 2024-02-01 MED ORDER — LIDOCAINE 2% (20 MG/ML) 5 ML SYRINGE
INTRAMUSCULAR | Status: DC | PRN
  Administered 2024-02-01: 60 mg via INTRAVENOUS

## 2024-02-01 MED ORDER — SODIUM CHLORIDE 0.9 % IR SOLN
Status: DC | PRN
  Administered 2024-02-01: 3000 mL

## 2024-02-01 MED ORDER — POVIDONE-IODINE 10 % EX SWAB
2.0000 | Freq: Once | CUTANEOUS | Status: AC
  Administered 2024-02-01: 2 via TOPICAL

## 2024-02-01 MED ORDER — MIDAZOLAM HCL 2 MG/2ML IJ SOLN
INTRAMUSCULAR | Status: DC | PRN
  Administered 2024-02-01: 2 mg via INTRAVENOUS

## 2024-02-01 MED ORDER — LACTATED RINGERS IV SOLN: INTRAVENOUS | Status: DC | PRN

## 2024-02-01 MED ORDER — SCOPOLAMINE 1 MG/3DAYS TD PT72
MEDICATED_PATCH | TRANSDERMAL | Status: AC
  Filled 2024-02-01: qty 1

## 2024-02-01 SURGICAL SUPPLY — 20 items
CATH ROBINSON RED A/P 16FR (CATHETERS) ×1 IMPLANT
COVER MAYO STAND STRL (DRAPES) ×2 IMPLANT
DEVICE MYOSURE LITE (MISCELLANEOUS) IMPLANT
DEVICE MYOSURE REACH (MISCELLANEOUS) IMPLANT
DILATOR CANAL MILEX (MISCELLANEOUS) IMPLANT
GLOVE BIOGEL PI IND STRL 7.0 (GLOVE) ×2 IMPLANT
GLOVE ECLIPSE 7.0 STRL STRAW (GLOVE) ×2 IMPLANT
GLOVE SURG UNDER POLY LF SZ7 (GLOVE) ×2 IMPLANT
GOWN STRL REUS W/ TWL LRG LVL3 (GOWN DISPOSABLE) ×2 IMPLANT
GOWN STRL REUS W/ TWL XL LVL3 (GOWN DISPOSABLE) ×1 IMPLANT
KIT PROCEDURE FLUENT (KITS) ×1 IMPLANT
KIT TURNOVER KIT B (KITS) ×1 IMPLANT
NS IRRIG 1000ML POUR BTL (IV SOLUTION) ×1 IMPLANT
PACK VAGINAL MINOR WOMEN LF (CUSTOM PROCEDURE TRAY) ×1 IMPLANT
PAD OB MATERNITY 11 LF (PERSONAL CARE ITEMS) ×1 IMPLANT
SEAL ROD LENS SCOPE MYOSURE (ABLATOR) ×1 IMPLANT
SET GENESYS HTA PROCERVA (MISCELLANEOUS) ×1 IMPLANT
SYR TOOMEY 50ML (SYRINGE) ×1 IMPLANT
TOWEL GREEN STERILE FF (TOWEL DISPOSABLE) ×2 IMPLANT
UNDERPAD 30X36 HEAVY ABSORB (UNDERPADS AND DIAPERS) ×1 IMPLANT

## 2024-02-01 NOTE — Discharge Instructions (Addendum)
 Post-surgical Instructions, Outpatient Surgery  You may expect to feel dizzy, weak, and drowsy for as long as 24 hours after receiving the medicine that made you sleep (anesthetic). For the first 24 hours after your surgery:   Do not drive a car, ride a bicycle, participate in physical activities, or take public transportation until you are done taking narcotic pain medicines or as directed by Dr. Annabell Key.  Do not drink alcohol or take tranquilizers.  Do not take medicine that has not been prescribed by your physicians.  Do not sign important papers or make important decisions while on narcotic pain medicines.  Have a responsible person with you.   PAIN MANAGEMENT Tylenol  500mg , two tablets every 6 hours as needed.  DO'S AND DON'T'S Do not take a tub bath for one week.  You may shower on the first day after your surgery Do not do any heavy lifting for one to two weeks.  This increases the chance of bleeding. Do move around as you feel able.  Stairs are fine.  You may begin to exercise again as you feel able.  Do not lift any weights for two weeks. Do not put anything in the vagina for two weeks--no tampons, intercourse, or douching.    REGULAR MEDIATIONS/VITAMINS: You may restart all of your regular medications as prescribed. You may restart all of your vitamins as you normally take them.    PLEASE CALL OR SEEK MEDICAL CARE IF: You have persistent nausea and vomiting.  You have trouble eating or drinking.  You have an oral temperature above 100.5.  You have constipation that is not helped by adjusting diet or increasing fluid intake. Pain medicines are a common cause of constipation.  You have heavy vaginal bleeding

## 2024-02-01 NOTE — Transfer of Care (Signed)
 Immediate Anesthesia Transfer of Care Note  Patient: Sarah George  Procedure(s) Performed: HYSTEROSCOPY  Patient Location: PACU  Anesthesia Type:General  Level of Consciousness: awake, drowsy, patient cooperative, and responds to stimulation  Airway & Oxygen Therapy: Patient Spontanous Breathing  Post-op Assessment: Report given to RN and Post -op Vital signs reviewed and stable  Post vital signs: Reviewed and stable  Last Vitals:  Vitals Value Taken Time  BP 112/59 02/01/24 15:43  Temp 36.5 C 02/01/24 15:43  Pulse 93 02/01/24 15:43  Resp 14 02/01/24 15:43  SpO2 100 % 02/01/24 15:43  Vitals shown include unfiled device data.  Last Pain:  Vitals:   02/01/24 1542  TempSrc:   PainSc: 0-No pain         Complications: No notable events documented.

## 2024-02-01 NOTE — Anesthesia Procedure Notes (Signed)
 Procedure Name: LMA Insertion Date/Time: 02/01/2024 2:58 PM  Performed by: Alisia Apple, CRNAPre-anesthesia Checklist: Patient identified, Emergency Drugs available, Suction available and Patient being monitored Patient Re-evaluated:Patient Re-evaluated prior to induction Oxygen Delivery Method: Circle system utilized Preoxygenation: Pre-oxygenation with 100% oxygen Induction Type: IV induction LMA: LMA inserted LMA Size: 4.0 Number of attempts: 1 Placement Confirmation: positive ETCO2 Tube secured with: Tape Dental Injury: Teeth and Oropharynx as per pre-operative assessment  Comments: Inserted by Alline Ivans, MD.

## 2024-02-01 NOTE — H&P (Signed)
 Sarah George is an 53 y.o. female G0 SWF with history of menorrhagia here for hysteroscopy, hydrotheramal abaltion, possible D&C and possible hysteroscopic myomectomy for treatment of this bleeding.  Endometrial biopsy pre op was negative.  Last ultrasound showed uterus measruing 6.5 x 5.7 x 4.5cm with fibroids.  Sonohysterogram in December showed 1.6cm submucosal fibroid present.  Procedure reviewed.  She knows she should not get pregnant after this.  Does not desire tubal procedure.  Questions answered.  Pt ready to proceed.  Last menstrual bleeding was in March.    Pertinent Gynecological History: Menses: amenorrhea on current progesterone Bleeding: h/o menorrhagia Contraception: POP DES exposure: denies Blood transfusions: none Sexually transmitted diseases: no past history Previous GYN Procedures: hysteroscopy in 2024  Last mammogram: normal Date: 09/15/2023 Last pap: normal Date: 12/2023 OB History: G0, P0   Menstrual History: Patient's last menstrual period was 10/17/2023 (approximate).    Past Medical History:  Diagnosis Date   Anemia    Anxiety    Deviated nasal septum    Family history of adverse reaction to anesthesia    mom with PONV   Fibroids, submucosal 04/14/2018   several fibroids measuring 8 x 8mm, 2.3 x 2.3cm, 8 x 6mm, and 10 x 7mm   GERD (gastroesophageal reflux disease)    History of abnormal Pap smear 09/1991   LEEP, CIN I   History of spider veins    Left cataract    History of    Lipoma 03/24/2014   Left supraclavicular   PONV (postoperative nausea and vomiting)     Past Surgical History:  Procedure Laterality Date   ABDOMINOPLASTY  07/17/2021   in [place]   CATARACT EXTRACTION Left 2012   DILATATION & CURETTAGE/HYSTEROSCOPY WITH MYOSURE N/A 05/03/2018   Procedure: DILATATION & CURETTAGE/HYSTEROSCOPY WITH MYOSURE;  Surgeon: Lillian Rein, MD;  Location: [facility];  Service: Gynecology;  Laterality: N/A;  Fibroid resection    DILITATION & CURRETTAGE/HYSTROSCOPY WITH HYDROTHERMAL ABLATION N/A 11/18/2022   Procedure: DILATATION & CURETTAGE/HYSTEROSCOPY WITH MYOSURE RESECTION OF FIBROID, ENDOMETRIAL BIOPSY.;  Surgeon: Lillian Rein, MD;  Location: [facility] OR;  Service: Gynecology;  Laterality: N/A;   LAPAROSCOPIC OVARIAN CYSTECTOMY Left 05/03/2018   Procedure: LAPAROSCOPIC OVARIAN CYSTECTOMY  possible LSO;  Surgeon: Lillian Rein, MD;  Location: [facility];  Service: Gynecology;  Laterality: Left;  Possible LSO   LEEP  1993   CIN I   RHINOPLASTY  1997   ROBOT ASSISTED LAPAROSCOPIC NEPHRECTOMY Right 09/23/2023   Procedure: XI RIGHT ROBOTIC ASSISTED RADICAL LAPAROSCOPIC NEPHRECTOMY;  Surgeon: Melody Spurling., MD;  Location: [facility];  Service: Urology;  Laterality: Right;   Sclerotherapy  11/06/2015   Septorhinoplasty  01/23/2005   TONSILLECTOMY  06/2013    Family History  Problem Relation Age of Onset   Breast cancer Maternal Aunt    Breast cancer Cousin        maternal   Colon cancer Mother 52   Colon cancer Maternal Grandmother    Colon cancer Maternal Grandfather    Colon cancer Maternal Uncle    Lung cancer Paternal Grandfather     Social History:  reports that she has never smoked. She has never used smokeless tobacco. She reports current alcohol use. She reports that she does not use drugs.  Allergies:  Allergies  Allergen Reactions   Nsaids     Gastritis, possible ulcers    Topamax [Topiramate]     agitation and insomnia    Medications  Prior to Admission  Medication Sig Dispense Refill Last Dose/Taking   MAGNESIUM PO Take 1 tablet by mouth at bedtime.   Past Week   Multiple Vitamin (MULTIVITAMIN WITH MINERALS) TABS tablet Take 1 tablet by mouth daily.   Past Week   norethindrone  (MICRONOR ) 0.35 MG tablet TAKE 1 TABLET BY MOUTH EVERY DAY 84 tablet 1 01/31/2024 Bedtime   pantoprazole (PROTONIX) 40 MG tablet Take 40 mg by mouth daily as needed (acid reflux).   Past Month     Review of Systems  Constitutional: Negative.   Genitourinary:        History of heavy bleeding    Blood pressure 106/89, pulse 83, temperature 98.4 F (36.9 C), temperature source Oral, resp. rate 18, height 5' 1 (1.549 m), weight 64.9 kg, last menstrual period 10/17/2023, SpO2 99%. Physical Exam Constitutional:      Appearance: Normal appearance.   Cardiovascular:     Rate and Rhythm: Normal rate and regular rhythm.  Pulmonary:     Effort: Pulmonary effort is normal.     Breath sounds: Normal breath sounds.   Neurological:     General: No focal deficit present.     Mental Status: She is alert.   Psychiatric:        Mood and Affect: Mood normal.     Results for orders placed or performed during the hospital encounter of 02/01/24 (from the past 24 hours)  CBC     Status: Abnormal   Collection Time: 02/01/24 12:20 PM  Result Value Ref Range   WBC 9.6 4.0 - 10.5 K/uL   RBC 5.10 3.87 - 5.11 MIL/uL   Hemoglobin 16.0 (H) 12.0 - 15.0 g/dL   HCT 57.8 (H) 46.9 - 62.9 %   MCV 93.9 80.0 - 100.0 fL   MCH 31.4 26.0 - 34.0 pg   MCHC 33.4 30.0 - 36.0 g/dL   RDW 52.8 41.3 - 24.4 %   Platelets 195 150 - 400 K/uL   nRBC 0.0 0.0 - 0.2 %  Type and screen     Status: None   Collection Time: 02/01/24 12:20 PM  Result Value Ref Range   ABO/RH(D) A POS    Antibody Screen NEG    Sample Expiration      02/04/2024,2359 Performed at [facility], [address] [place], [place] [address]   Pregnancy, urine POC     Status: None   Collection Time: 02/01/24 12:40 PM  Result Value Ref Range   Preg Test, Ur NEGATIVE NEGATIVE    No results found.  Assessment/Plan: 53 yo G0 SWF with h/o menorrhagia and fibroid uterus with no fibroid >3cm here for hysteroscopy, hydrothermal ablation, possible fibroid resection. Questions answered.  Pt ready to proceed.  Lillian Rein 02/01/2024, 2:15 PM

## 2024-02-01 NOTE — Op Note (Signed)
 02/01/2024  3:52 PM  PATIENT:  Sarah George  53 y.o. female  PRE-OPERATIVE DIAGNOSIS:  Fibroids, h/o menorrhagia  POST-OPERATIVE DIAGNOSIS: Type 1 submucosal fibroid almost filling the endometrial cavity  PROCEDURE:  Procedure(s): HYSTEROSCOPY  SURGEON:  Lillian Rein  ASSISTANTS:  OR staff  ANESTHESIA:   general  ESTIMATED BLOOD LOSS: 5 mL  BLOOD ADMINISTERED:none   FLUIDS: 1000cc LR  UOP: 50cc drained with I&O cath  SPECIMEN:  none  DISPOSITION OF SPECIMEN:  N/A  FINDINGS: Type 1 fibroid almost filling the endometrial cavity.    DESCRIPTION OF OPERATION: Patient was taken to the operating room.  She is placed in the supine position. SCDs were on her lower extremities and functioning properly. General anesthesia with an LMA was administered without difficulty. Dr. Fitgzerald, anesthesia, oversaw case.  Legs were then placed in the [facility] stirrups in the low lithotomy position. The legs were lifted to the high lithotomy position and the Betadine  prep was used on the inner thighs perineum and vagina x3. Patient was draped in a normal standard fashion. An in and out catheterization with a red rubber Foley catheter was performed. Approximately 50 cc of clear urine was noted. A bivalve speculum was placed the vagina. The anterior lip of the cervix was grasped with single-tooth tenaculum.  A paracervical block of 1% lidocaine  mixed one-to-one with epinephrine  (1:100,000 units).  10 cc was used total. The cervix is dilated up to #21 [facility] dilators. The endometrial cavity sounded to 7 cm.   A HTA hysteroscope was obtained. Normal saline was used as a hysteroscopic fluid. The hysteroscope was advanced through the endocervical canal into the endometrial cavity. A type 1 fibroid filled the anterior fundal region.  Given it's size, the HTA was not possible as it would not likely get all the way around the fibroid.  With manipulation of the hysteroscope, the tubal ostia were noted  bilaterally.  Give her biggest concern of not have bleeding, decision made to stop the procedure and not proceed with anything else.  Prior to doing this, I spoke with emergency contact, her aunt, to discuss findings and procedure.  She also felt comfortable with the decision to stop the procedure.    Then the hysteroscope was removed. The fluid deficit was 210 cc. The tenaculum was removed from the anterior lip of the cervix. The speculum was removed from the vagina. The prep was cleansed of the patient's skin. The legs are positioned back in the supine position. Sponge, lap, needle, instrument counts were correct x2. Patient was taken to recovery in stable condition.  COUNTS:  YES  PLAN OF CARE: Transfer to PACU

## 2024-02-01 NOTE — Anesthesia Preprocedure Evaluation (Addendum)
 Anesthesia Evaluation  Patient identified by MRN, date of birth, ID band Patient awake    Reviewed: Allergy & Precautions, NPO status , Patient's Chart, lab work & pertinent test results  History of Anesthesia Complications (+) PONV and history of anesthetic complications  Airway Mallampati: II  TM Distance: >3 FB Neck ROM: Full    Dental  (+) Dental Advisory Given   Pulmonary neg pulmonary ROS   breath sounds clear to auscultation       Cardiovascular negative cardio ROS  Rhythm:Regular Rate:Normal     Neuro/Psych negative neurological ROS     GI/Hepatic Neg liver ROS,GERD  ,,  Endo/Other  negative endocrine ROS    Renal/GU Renal disease (S/p nephrectomy)     Musculoskeletal   Abdominal   Peds  Hematology negative hematology ROS (+)   Anesthesia Other Findings   Reproductive/Obstetrics (+) Pregnancy                             Anesthesia Physical Anesthesia Plan  ASA: 2  Anesthesia Plan: General   Post-op Pain Management: Tylenol  PO (pre-op)* and Toradol  IV (intra-op)*   Induction:   PONV Risk Score and Plan: 4 or greater and Propofol  infusion, Dexamethasone , Ondansetron , Midazolam  and TIVA  Airway Management Planned: LMA  Additional Equipment:   Intra-op Plan:   Post-operative Plan: Extubation in OR  Informed Consent: I have reviewed the patients History and Physical, chart, labs and discussed the procedure including the risks, benefits and alternatives for the proposed anesthesia with the patient or authorized representative who has indicated his/her understanding and acceptance.     Dental advisory given  Plan Discussed with: CRNA  Anesthesia Plan Comments:        Anesthesia Quick Evaluation

## 2024-02-02 ENCOUNTER — Encounter (HOSPITAL_BASED_OUTPATIENT_CLINIC_OR_DEPARTMENT_OTHER): Payer: Self-pay | Admitting: Obstetrics & Gynecology

## 2024-02-02 ENCOUNTER — Encounter (HOSPITAL_COMMUNITY): Payer: Self-pay | Admitting: Obstetrics & Gynecology

## 2024-02-02 NOTE — Anesthesia Postprocedure Evaluation (Signed)
 Anesthesia Post Note  Patient: Sarah George  Procedure(s) Performed: HYSTEROSCOPY     Patient location during evaluation: PACU Anesthesia Type: General Level of consciousness: awake and alert Pain management: pain level controlled Vital Signs Assessment: post-procedure vital signs reviewed and stable Respiratory status: spontaneous breathing, nonlabored ventilation, respiratory function stable and patient connected to nasal cannula oxygen Cardiovascular status: blood pressure returned to baseline and stable Postop Assessment: no apparent nausea or vomiting Anesthetic complications: no   There were no known notable events for this encounter.  Last Vitals:  Vitals:   02/01/24 1600 02/01/24 1615  BP: 120/85 121/85  Pulse: 87 83  Resp: 18 18  Temp:  36.5 C  SpO2: 100% 100%    Last Pain:  Vitals:   02/01/24 1542  TempSrc:   PainSc: 0-No pain                 Melvenia Stabs

## 2024-02-16 ENCOUNTER — Other Ambulatory Visit (INDEPENDENT_AMBULATORY_CARE_PROVIDER_SITE_OTHER): Payer: Self-pay

## 2024-02-16 ENCOUNTER — Ambulatory Visit: Admitting: Physician Assistant

## 2024-02-16 DIAGNOSIS — M7712 Lateral epicondylitis, left elbow: Secondary | ICD-10-CM

## 2024-02-16 MED ORDER — LIDOCAINE HCL 1 % IJ SOLN
2.0000 mL | INTRAMUSCULAR | Status: AC | PRN
  Administered 2024-02-16: 2 mL

## 2024-02-16 MED ORDER — BUPIVACAINE HCL 0.25 % IJ SOLN
2.0000 mL | INTRAMUSCULAR | Status: AC | PRN
  Administered 2024-02-16: 2 mL

## 2024-02-16 MED ORDER — METHYLPREDNISOLONE ACETATE 40 MG/ML IJ SUSP
40.0000 mg | INTRAMUSCULAR | Status: AC | PRN
  Administered 2024-02-16: 40 mg

## 2024-02-16 NOTE — Progress Notes (Signed)
 Office Visit Note   Patient: Sarah George           Date of Birth: 05/15/1971           MRN: [id number] Visit Date: 02/16/2024              Requested by: Cleotilde, Virginia  E, PA [address] [place],  [place] [address] PCP: Cleotilde, [place]  E, PA   Assessment & Plan: Visit Diagnoses:  1. Lateral epicondylitis, left elbow     Plan: Impression is left elbow lateral epicondylitis.  Today, we discussed various treatment options to include cortisone injection, tennis elbow strap and tennis elbow exercises.  She is agreeable to this plan.  As far as her shoulder, feel as though she may be awkwardly using this secondary to her left elbow pain.  If her shoulder symptoms do not improve over the next 4 to 6 weeks or so, she will follow-up for further evaluation treatment recommendation.  Call with concerns or questions.  Follow-Up Instructions: Return if symptoms worsen or fail to improve.   Orders:  Orders Placed This Encounter  Procedures   Hand/UE Inj   XR Elbow Complete Left (3+View)   No orders of the defined types were placed in this encounter.     Procedures: Hand/UE Inj for lateral epicondylitis on 02/16/2024 3:37 PM Medications: 2 mL lidocaine  1 %; 2 mL bupivacaine  0.25 %; 40 mg methylPREDNISolone acetate 40 MG/ML      Clinical Data: No additional findings.   Subjective: Chief Complaint  Patient presents with   Left Elbow - Pain    HPI patient is a pleasant 53 year old right-hand-dominant female who comes in today with left elbow pain for the past 6 months.  She denies any injury but notes she was previously doing strength training prior to her recent surgery.  The pain she is having is to the lateral elbow with some radiation into the forearm and more recently to the upper arm.  Symptoms are worse when she is picking up a cup of coffee as well as when she does things such as unplugging something.  She is not taking any medication for this.  She denies  any paresthesias.  Of note, she is a Sales executive.  Review of Systems as detailed in HPI.  All others reviewed and are negative.   Objective: Vital Signs: LMP 10/17/2023 (Approximate) Comment: DOS UPREG NEGATIVE  Physical Exam well-developed well-nourished female in no acute distress.  Alert and oriented x 3.  Ortho Exam left elbow exam: Moderate tenderness to the lateral epicondyle.  Minimal tenderness to the radial tunnel.  She does not have any increased pain with wrist extension, flexion, supination, pronation, extension or flexion.  She is neurovascularly intact distally.  Specialty Comments:  No specialty comments available.  Imaging: XR Elbow Complete Left (3+View) Result Date: 02/16/2024 No acute or structural abnormalities    PMFS History: Patient Active Problem List   Diagnosis Date Noted   S/p nephrectomy 12/24/2023   Renal mass, right 09/23/2023   History of menorrhagia 11/18/2022   History of anemia 11/18/2022   Recurrent vaginitis 06/22/2021   H/O LEEP 06/22/2021   Intramural leiomyoma of uterus 06/22/2021   Ovarian neoplasm 04/14/2018   Fibroids, submucosal 04/14/2018   DUB (dysfunctional uterine bleeding) 02/10/2014   Past Medical History:  Diagnosis Date   Anemia    Anxiety    Deviated nasal septum    Family history of adverse reaction to anesthesia  mom with PONV   Fibroids, submucosal 04/14/2018   several fibroids measuring 8 x 8mm, 2.3 x 2.3cm, 8 x 6mm, and 10 x 7mm   GERD (gastroesophageal reflux disease)    History of abnormal Pap smear 09/1991   LEEP, CIN I   History of spider veins    Left cataract    History of    Lipoma 03/24/2014   Left supraclavicular   PONV (postoperative nausea and vomiting)     Family History  Problem Relation Age of Onset   Breast cancer Maternal Aunt    Breast cancer Cousin        maternal   Colon cancer Mother 57   Colon cancer Maternal Grandmother    Colon cancer Maternal Grandfather    Colon cancer  Maternal Uncle    Lung cancer Paternal Grandfather     Past Surgical History:  Procedure Laterality Date   ABDOMINOPLASTY  07/17/2021   in [place]   CATARACT EXTRACTION Left 2012   DILATATION & CURETTAGE/HYSTEROSCOPY WITH MYOSURE N/A 05/03/2018   Procedure: DILATATION & CURETTAGE/HYSTEROSCOPY WITH MYOSURE;  Surgeon: Cleotilde Ronal RAMAN, MD;  Location: [facility] ;  Service: Gynecology;  Laterality: N/A;  Fibroid resection   DILITATION & CURRETTAGE/HYSTROSCOPY WITH HYDROTHERMAL ABLATION N/A 11/18/2022   Procedure: DILATATION & CURETTAGE/HYSTEROSCOPY WITH MYOSURE RESECTION OF FIBROID, ENDOMETRIAL BIOPSY.;  Surgeon: Cleotilde Ronal RAMAN, MD;  Location: [facility] OR;  Service: Gynecology;  Laterality: N/A;   HYSTEROSCOPY WITH D & C N/A 02/01/2024   Procedure: HYSTEROSCOPY;  Surgeon: Cleotilde Ronal RAMAN, MD;  Location: [facility] OR;  Service: Gynecology;  Laterality: N/A;  Lucy [employer] rep will be at bedside   LAPAROSCOPIC OVARIAN CYSTECTOMY Left 05/03/2018   Procedure: LAPAROSCOPIC OVARIAN CYSTECTOMY  possible LSO;  Surgeon: Cleotilde Ronal RAMAN, MD;  [facility];  Service: Gynecology;  Laterality: Left;  Possible LSO   LEEP  1993   CIN I   RHINOPLASTY  1997   ROBOT ASSISTED LAPAROSCOPIC NEPHRECTOMY Right 09/23/2023   Procedure: XI RIGHT ROBOTIC ASSISTED RADICAL LAPAROSCOPIC NEPHRECTOMY;  Surgeon: Alvaro Ricardo KATHEE Mickey., MD;  Location: [facility];  Service: Urology;  Laterality: Right;   Sclerotherapy  11/06/2015   Septorhinoplasty  01/23/2005   TONSILLECTOMY  06/2013   Social History   Occupational History   Not on file  Tobacco Use   Smoking status: Never   Smokeless tobacco: Never  Vaping Use   Vaping status: Never Used  Substance and Sexual Activity   Alcohol use: Yes    Comment: occasionallly   Drug use: No   Sexual activity: Yes    Partners: Male    Birth control/protection: None

## 2024-02-25 ENCOUNTER — Other Ambulatory Visit: Payer: Self-pay | Admitting: Genetic Counselor

## 2024-02-25 ENCOUNTER — Inpatient Hospital Stay

## 2024-02-25 ENCOUNTER — Encounter: Payer: Self-pay | Admitting: Genetic Counselor

## 2024-02-25 ENCOUNTER — Inpatient Hospital Stay: Attending: Internal Medicine | Admitting: Genetic Counselor

## 2024-02-25 DIAGNOSIS — Z8042 Family history of malignant neoplasm of prostate: Secondary | ICD-10-CM | POA: Diagnosis not present

## 2024-02-25 DIAGNOSIS — Z803 Family history of malignant neoplasm of breast: Secondary | ICD-10-CM

## 2024-02-25 DIAGNOSIS — Z1379 Encounter for other screening for genetic and chromosomal anomalies: Secondary | ICD-10-CM

## 2024-02-25 DIAGNOSIS — Z85528 Personal history of other malignant neoplasm of kidney: Secondary | ICD-10-CM

## 2024-02-25 DIAGNOSIS — Z800 Family history of malignant neoplasm of digestive organs: Secondary | ICD-10-CM

## 2024-02-25 LAB — GENETIC SCREENING ORDER

## 2024-02-25 NOTE — Progress Notes (Signed)
 REFERRING PROVIDER: Cleotilde, Virginia  E, PA [address] [place],  [place] [address]  PRIMARY PROVIDER:  Cleotilde, Virginia  E, PA  PRIMARY REASON FOR VISIT:  1. History of renal cell cancer   2. Family history of prostate cancer   3. Family history of colon cancer   4. Family history of breast cancer    HISTORY OF PRESENT ILLNESS:   Sarah George, a 53 y.o. female, was seen for a [facility] cancer genetics consultation at the request of Dr. Cleotilde due to a personal and family history of cancer.  Sarah George presents to clinic today to discuss the possibility of a hereditary predisposition to cancer, to discuss genetic testing, and to further clarify her future cancer risks, as well as potential cancer risks for family members.   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  RISK FACTORS:  Menarche was at age 84.  First live birth at age n/a.  OCP use for approximately 10+ years.  Ovaries intact: yes.  Uterus intact: yes.  Menopausal status: premenopausal.  HRT use: 0 years. Colonoscopy: no Mammogram within the last year: yes. Number of breast biopsies: 0. Up to date with pelvic exams: yes. Any excessive radiation exposure in the past: no  Past Medical History:  Diagnosis Date   Anemia    Anxiety    Deviated nasal septum    Family history of adverse reaction to anesthesia    mom with PONV   Fibroids, submucosal 04/14/2018   several fibroids measuring 8 x 8mm, 2.3 x 2.3cm, 8 x 6mm, and 10 x 7mm   GERD (gastroesophageal reflux disease)    History of abnormal Pap smear 09/1991   LEEP, CIN I   History of spider veins    Left cataract    History of    Lipoma 03/24/2014   Left supraclavicular   PONV (postoperative nausea and vomiting)     Past Surgical History:  Procedure Laterality Date   ABDOMINOPLASTY  07/17/2021   in [place]   CATARACT EXTRACTION Left 2012   DILATATION & CURETTAGE/HYSTEROSCOPY WITH  MYOSURE N/A 05/03/2018   Procedure: DILATATION & CURETTAGE/HYSTEROSCOPY WITH MYOSURE;  Surgeon: Cleotilde Ronal RAMAN, MD;  Location: [facility];  Service: Gynecology;  Laterality: N/A;  Fibroid resection   DILITATION & CURRETTAGE/HYSTROSCOPY WITH HYDROTHERMAL ABLATION N/A 11/18/2022   Procedure: DILATATION & CURETTAGE/HYSTEROSCOPY WITH MYOSURE RESECTION OF FIBROID, ENDOMETRIAL BIOPSY.;  Surgeon: Cleotilde Ronal RAMAN, MD;  Location: [facility] OR;  Service: Gynecology;  Laterality: N/A;   HYSTEROSCOPY WITH D & C N/A 02/01/2024   Procedure: HYSTEROSCOPY;  Surgeon: Cleotilde Ronal RAMAN, MD;  Location: [facility] OR;  Service: Gynecology;  Laterality: N/A;  Lucy H[employer] rep will be at bedside   LAPAROSCOPIC OVARIAN CYSTECTOMY Left 05/03/2018   Procedure: LAPAROSCOPIC OVARIAN CYSTECTOMY  possible LSO;  Surgeon: Cleotilde Ronal RAMAN, MD;  Location: [facility];  Service: Gynecology;  Laterality: Left;  Possible LSO   LEEP  1993   CIN I   RHINOPLASTY  1997   ROBOT ASSISTED LAPAROSCOPIC NEPHRECTOMY Right 09/23/2023   Procedure: XI RIGHT ROBOTIC ASSISTED RADICAL LAPAROSCOPIC NEPHRECTOMY;  Surgeon: Alvaro Ricardo KATHEE Mickey., MD;  Location: [facility];  Service: Urology;  Laterality: Right;   Sclerotherapy  11/06/2015   Septorhinoplasty  01/23/2005   TONSILLECTOMY  06/2013    Social History   Socioeconomic History   Marital status: Single    Spouse name: Not on file  Number of children: 0   Years of education: Not on file   Highest education level: Not on file  Occupational History   Not on file  Tobacco Use   Smoking status: Never   Smokeless tobacco: Never  Vaping Use   Vaping status: Never Used  Substance and Sexual Activity   Alcohol use: Yes    Comment: occasionallly   Drug use: No   Sexual activity: Yes    Partners: Male    Birth control/protection: None  Other Topics Concern   Not on file  Social History Narrative   Not on file   Social Drivers of Health   Financial Resource Strain: Not on file   Food Insecurity: No Food Insecurity (09/23/2023)   Hunger Vital Sign    Worried About Running Out of Food in the Last Year: Never true    Ran Out of Food in the Last Year: Never true  Transportation Needs: No Transportation Needs (09/23/2023)   PRAPARE - Administrator, Civil Service (Medical): No    Lack of Transportation (Non-Medical): No  Physical Activity: Not on file  Stress: Not on file  Social Connections: Not on file     FAMILY HISTORY:  We obtained a detailed, 4-generation family history.  Significant diagnoses are listed below: Family History  Problem Relation Age of Onset   Colon cancer Mother 36 - 57   Prostate cancer Father 47 - 46       metastatic   Breast cancer Maternal Aunt 75 - 59   Colon cancer Maternal Uncle    Colon cancer Maternal Grandfather    Prostate cancer Paternal Grandfather    Breast cancer Maternal Cousin 30 - 39   Prostate cancer Paternal Cousin 15 - 27      Sarah George is unaware of previous family history of genetic testing for hereditary cancer risks. There is no reported Ashkenazi Jewish ancestry.   GENETIC COUNSELING ASSESSMENT: Sarah George is a 53 y.o. female with a personal and family history of cancer which is somewhat suggestive of a hereditary predisposition to cancer. We, therefore, discussed and recommended the following at today's visit.   DISCUSSION: We discussed that 5 - 10% of cancer is hereditary, with most cases of colon cancer associated with Lynch Syndrome.  There are other genes that can be associated with hereditary colon cancer syndromes.  There are also numerous genes associated with kidney cancer as well as breast and prostate cancer. We discussed that testing is beneficial for several reasons including knowing how to follow individuals after completing their treatment and understanding if other family members could be at risk for cancer and allowing them to undergo genetic testing.   We reviewed the  characteristics, features and inheritance patterns of hereditary cancer syndromes. We also discussed genetic testing, including the appropriate family members to test, the process of testing, insurance coverage and turn-around-time for results. We discussed the implications of a negative, positive, carrier and/or variant of uncertain significant result. We recommended Sarah George pursue genetic testing for a panel that includes genes associated with kidney, colon, breast, and prostate cancer.   Sarah George  was offered a common hereditary cancer panel (40 genes+kidney cancer genes) and an expanded pan-cancer panel (77 genes). Sarah George was informed of the benefits and limitations of each panel, including that expanded pan-cancer panels contain genes that do not have clear management guidelines at this point in time.  We also discussed that as the number of genes included  on a panel increases, the chances of variants of uncertain significance increases. After considering the benefits and limitations of each gene panel, Sarah George elected to have [facility] CancerNext-Expanded Panel.  The CancerNext-Expanded gene panel offered by [facility] and includes sequencing, rearrangement, and RNA analysis for the following 77 genes: AIP, ALK, APC, ATM, AXIN2, BAP1, BARD1, BMPR1A, BRCA1, BRCA2, BRIP1, CDC73, CDH1, CDK4, CDKN1B, CDKN2A, CEBPA, CHEK2, CTNNA1, DDX41, DICER1, ETV6, FH, FLCN, GATA2, LZTR1, MAX, MBD4, MEN1, MET, MLH1, MSH2, MSH3, MSH6, MUTYH, NF1, NF2, NTHL1, PALB2, PHOX2B, PMS2, POT1, PRKAR1A, PTCH1, PTEN, RAD51C, RAD51D, RB1, RET, RPS20, RUNX1, SDHA, SDHAF2, SDHB, SDHC, SDHD, SMAD4, SMARCA4, SMARCB1, SMARCE1, STK11, SUFU, TMEM127, TP53, TSC1, TSC2, VHL, and WT1 (sequencing and deletion/duplication); EGFR, HOXB13, KIT, MITF, PDGFRA, POLD1, and POLE (sequencing only); EPCAM and GREM1 (deletion/duplication only).   Based on Sarah George's personal and family history of cancer, she meets medical criteria  for genetic testing. Despite that she meets criteria, she may still have an out of pocket cost. We discussed that if her out of pocket cost for testing is over $100, the laboratory should contact them to discuss self-pay prices, patient pay assistance programs, if applicable, and other billing options.  PLAN: After considering the risks, benefits, and limitations, Sarah George provided informed consent to pursue genetic testing and the blood sample was sent to [facility] for analysis of the CancerNext-Expanded Panel. Results should be available within approximately 2-3 weeks' time, at which point they will be disclosed by telephone to Sarah George, as will any additional recommendations warranted by these results. Sarah George will receive a summary of her genetic counseling visit and a copy of her results once available. This information will also be available in Epic.   Sarah George questions were answered to her satisfaction today. Our contact information was provided should additional questions or concerns arise. Thank you for the referral and allowing us  to share in the care of your patient.   Sarah Spivack, MS, [facility] Genetic Counselor [email or web address] (P) [phone]  40 minutes were spent on the date of the encounter in service to the patient including preparation, face-to-face consultation, documentation and care coordination. The patient was seen alone.  Drs. Gudena and/or Lanny were available to discuss this case as needed.   _______________________________________________________________________ For Office Staff:  Number of people involved in session: 1 Was an Intern/ student involved with case: no

## 2024-03-11 ENCOUNTER — Telehealth: Payer: Self-pay | Admitting: Genetic Counselor

## 2024-03-11 ENCOUNTER — Encounter: Payer: Self-pay | Admitting: Genetic Counselor

## 2024-03-11 ENCOUNTER — Ambulatory Visit: Payer: Self-pay | Admitting: Genetic Counselor

## 2024-03-11 DIAGNOSIS — Z1379 Encounter for other screening for genetic and chromosomal anomalies: Secondary | ICD-10-CM | POA: Insufficient documentation

## 2024-03-11 NOTE — Progress Notes (Signed)
 HPI:   Sarah George was previously seen in the [facility] due to a personal and George history of cancer and concerns regarding a hereditary predisposition to cancer. Please refer to our prior cancer genetics clinic note for more information regarding our discussion, assessment and recommendations, at the time. Sarah George recent genetic test results were disclosed to her, as were recommendations warranted by these results. These results and recommendations are discussed in more detail below.  CANCER HISTORY:  Oncology History   No history exists.    George HISTORY:  We obtained a detailed, 4-generation George history.  Significant diagnoses are listed below:      George History  Problem Relation Age of Onset   Colon cancer Mother 27 - 19   Prostate cancer Father 18 - 39        metastatic   Breast cancer Maternal Aunt 42 - 59   Colon cancer Maternal Uncle     Colon cancer Maternal Grandfather     Prostate cancer Paternal Grandfather     Breast cancer Maternal Cousin 34 - 39   Prostate cancer Paternal Cousin 54 - 15             Sarah George is unaware of previous George history of genetic testing for hereditary cancer risks. There is no reported Ashkenazi Jewish ancestry.    GENETIC TEST RESULTS:  The [facility] CancerNext-Expanded Panel found no pathogenic mutations.   The CancerNext-Expanded gene panel offered by [facility] and includes sequencing, rearrangement, and RNA analysis for the following 77 genes: AIP, ALK, APC, ATM, AXIN2, BAP1, BARD1, BMPR1A, BRCA1, BRCA2, BRIP1, CDC73, CDH1, CDK4, CDKN1B, CDKN2A, CEBPA, CHEK2, CTNNA1, DDX41, DICER1, ETV6, FH, FLCN, GATA2, LZTR1, MAX, MBD4, MEN1, MET, MLH1, MSH2, MSH3, MSH6, MUTYH, NF1, NF2, NTHL1, PALB2, PHOX2B, PMS2, POT1, PRKAR1A, PTCH1, PTEN, RAD51C, RAD51D, RB1, RET, RPS20, RUNX1, SDHA, SDHAF2, SDHB, SDHC, SDHD, SMAD4, SMARCA4, SMARCB1, SMARCE1, STK11, SUFU, TMEM127, TP53, TSC1, TSC2, VHL, and WT1  (sequencing and deletion/duplication); EGFR, HOXB13, KIT, MITF, PDGFRA, POLD1, and POLE (sequencing only); EPCAM and GREM1 (deletion/duplication only).   The test report has been scanned into EPIC and is located under the Molecular Pathology section of the Results Review tab.  A portion of the result report is included below for reference. Genetic testing reported out on 03/08/2024.       Genetic testing identified a variant of uncertain significance (VUS) in the RAD51C gene called [id number].  At this time, it is unknown if this variant is associated with an increased risk for cancer or if it is benign, but most uncertain variants are reclassified to benign. It should not be used to make medical management decisions. With time, we suspect the laboratory will determine the significance of this variant, if any. If the laboratory reclassifies this variant, we will attempt to contact Sarah George to discuss it further.   Even though a pathogenic variant was not identified, possible explanations for the cancer in the George may include: There may be no hereditary risk for cancer in the George. The cancers in Sarah George may be due to other genetic or environmental factors. There may be a gene mutation in one of these genes that current testing methods cannot detect, but that chance is small. There could be another gene that has not yet been discovered, or that we have not yet tested, that is responsible for the cancer diagnoses in the George.  It is also possible there is a hereditary cause  for the cancer in the George that Sarah George did not inherit.  Therefore, it is important to remain in touch with cancer genetics in the future so that we can continue to offer Sarah George the most up to date genetic testing.   ADDITIONAL GENETIC TESTING:  We discussed with Sarah George that her genetic testing was fairly extensive.  If there are genes identified to increase cancer risk that can  be analyzed in the future, we would be happy to discuss and coordinate this testing at that time.    CANCER SCREENING RECOMMENDATIONS:  Sarah George test result is considered negative (normal).  This means that we have not identified a hereditary cause for her personal and George history of cancer at this time.  An individual's cancer risk and medical management are not determined by genetic test results alone. Overall cancer risk assessment incorporates additional factors, including personal medical history, George history, and any available genetic information that may result in a personalized plan for cancer prevention and surveillance. Therefore, it is recommended she continue to follow the cancer management and screening guidelines provided by her healthcare providers.  Based on the reported personal and George history, specific cancer screenings for Sarah George include:  Breast Cancer Screening: Multiple prediction models have been developed to assist with breast cancer risk prediction efforts for unaffected women without a known single gene risk factor identified in their George. The Tyrer-Cuzick model is one such risk assessment tool. This model was developed to include extensive George history information, endogenous estrogen exposure, and benign breast disease. The calculation is highly-dependent on the accuracy of clinical data provided by the patient. Other factors not accounted for in the calculation may impact lifetime breast cancer risk including, but not limited to, germline mutations not analyzed by the ordered genetic test or clinical information not provided at the time of the testing. The risk number provided is patient-specific and cannot be used to infer risk to relatives.  Sarah George Tyrer-Cuzick risk score is 15.6%. This is above the general population risk of 12%, therefore, she is encouraged to continue to be mindful of her George history and be diligent with  general population breast screening, including annual mammograms beginning 10 years prior to the youngest diagnosis in her George or by age 58.  She is encouraged to contact us  regarding any changes to her personal or George history, as her recommendations for screening would be altered significantly if her lifetime risk is determined to be greater than 20% based on updated information.    Colon Cancer Screening: Due to Sarah George's mother's history of colon cancer, she is recommended to repeat colonoscopies every 5 years. More frequent colonoscopies may be recommended if polyps are identified.  FOLLOW-UP:  Cancer genetics is a rapidly advancing field and it is possible that new genetic tests will be appropriate for her and/or her George members in the future. We encouraged her to remain in contact with cancer genetics on an annual basis so we can update her personal and George histories and let her know of advances in cancer genetics that may benefit this George.   Our contact number was provided. Sarah George questions were answered to her satisfaction, and she knows she is welcome to call us  at anytime with additional questions or concerns.   Webb Weed, MS, [facility] Genetic Counselor [email or web address] (P) [phone]

## 2024-03-11 NOTE — Telephone Encounter (Addendum)
 I contacted [name] to discuss her genetic testing results. No pathogenic variants were identified in the 77 genes analyzed. Of note, a variant of uncertain significance was identified in the RAD51C gene. Detailed clinic note to follow.  The test report has been scanned into EPIC and is located under the Molecular Pathology section of the Results Review tab.  A portion of the result report is included below for reference.   Sarah Vanderberg, MS, [facility] Genetic Counselor [email or web address] (P) [phone]

## 2024-03-22 ENCOUNTER — Ambulatory Visit: Payer: Self-pay | Admitting: Radiology

## 2024-03-22 ENCOUNTER — Encounter: Payer: Self-pay | Admitting: Radiology

## 2024-03-22 VITALS — Ht 60.25 in | Wt 142.0 lb

## 2024-03-22 DIAGNOSIS — N951 Menopausal and female climacteric states: Secondary | ICD-10-CM

## 2024-03-22 MED ORDER — ESTRADIOL 0.025 MG/24HR TD PTTW: 1.0000 | MEDICATED_PATCH | TRANSDERMAL | 2 refills | Status: DC

## 2024-03-22 MED ORDER — PROGESTERONE MICRONIZED 100 MG PO CAPS: 100.0000 mg | ORAL_CAPSULE | Freq: Every evening | ORAL | 3 refills | Status: DC

## 2024-03-22 NOTE — Progress Notes (Addendum)
   Sarah George 1970/10/13 [id number]   History:  53 y.o. G0 here as a new patient for HRT consult. Complains perimenopause symptoms x's years. Complains of decreased energy, mood swings, trouble sleeping. Not hot flashes or night sweats. Some joint pain. Currently on micronor  to control bleeding with fibroids. Planning for hysterectomy. Stopped IM testosterone prescribed by [facility] edge due to right nephrectomy 09/2023 (renal cell carcinoma). Hx uterine fibroids (failed endometrial ablation this years, due to fibroids. Most recent labs done 02/29/24 ([facility]) Estradiol : 47.4 FSH 65.7 Testosterone 11  Gynecologic History Patient's last menstrual period was 09/19/2023 (approximate). Period Cycle (Days):  (irregular since starting POP 3-4 years ago) Period Duration (Days): 7 Period Pattern: (!) Irregular Menstrual Flow: Light, Moderate, Heavy Menstrual Control: Thin pad, Maxi pad, Tampon Dysmenorrhea: None Last Pap: 12/21/23. Results were: normal Last mammogram: 09/15/23. Results were: normal  Obstetric History OB History  Gravida Para Term Preterm AB Living  0 0 0 0 0 0  SAB IAB Ectopic Multiple Live Births  0 0 0 0        The following portions of the patient's history were reviewed and updated as appropriate: allergies, current medications, past family history, past medical history, past social history, past surgical history, and problem list.  Review of Systems  All other systems reviewed and are negative.   Past medical history, past surgical history, family history and social history were all reviewed and documented in the EPIC chart.  Exam:  Vitals:   03/22/24 0953  Weight: 142 lb (64.4 kg)  Height: 5' 0.25 (1.53 m)   Body mass index is 27.5 kg/m.  Physical Exam Constitutional:      Appearance: Normal appearance.  Pulmonary:     Effort: Pulmonary effort is normal.  Neurological:     Mental Status: She is alert.  Psychiatric:        Mood and Affect: Mood  normal.        Thought Content: Thought content normal.        Judgment: Judgment normal.      Sarah George, CMA present for exam  Assessment/Plan:   1. Perimenopausal (Primary) Risks and benefits reviewed. Will start slowly to decrease irregular/breakthrough bleeding.  - estradiol  (VIVELLE -DOT) 0.025 MG/24HR; Place 1 patch onto the skin 2 (two) times a week.  Dispense: 8 patch; Refill: 2 - progesterone  (PROMETRIUM ) 100 MG capsule; Take 1 capsule (100 mg total) by mouth at bedtime.  Dispense: 30 capsule; Refill: 3  - Will consider re- adding testosterone once estrogen is managed Previously had rashes from compounded testosterone cream, did better with IM injections  Return in about 8 weeks (around 05/17/2024) for Med Follow-up .  Sarah George B WHNP-BC 11:03 AM 03/22/2024

## 2024-03-23 ENCOUNTER — Other Ambulatory Visit (HOSPITAL_BASED_OUTPATIENT_CLINIC_OR_DEPARTMENT_OTHER): Payer: Self-pay | Admitting: Obstetrics & Gynecology

## 2024-03-23 DIAGNOSIS — Z8742 Personal history of other diseases of the female genital tract: Secondary | ICD-10-CM

## 2024-03-23 DIAGNOSIS — D250 Submucous leiomyoma of uterus: Secondary | ICD-10-CM

## 2024-05-04 DIAGNOSIS — M7712 Lateral epicondylitis, left elbow: Secondary | ICD-10-CM | POA: Diagnosis not present

## 2024-05-16 DIAGNOSIS — M25522 Pain in left elbow: Secondary | ICD-10-CM | POA: Diagnosis not present

## 2024-05-18 ENCOUNTER — Encounter (HOSPITAL_BASED_OUTPATIENT_CLINIC_OR_DEPARTMENT_OTHER): Payer: Self-pay | Admitting: Obstetrics & Gynecology

## 2024-05-18 ENCOUNTER — Encounter: Payer: Self-pay | Admitting: Radiology

## 2024-05-18 ENCOUNTER — Telehealth: Payer: Self-pay

## 2024-05-18 ENCOUNTER — Ambulatory Visit: Admitting: Radiology

## 2024-05-18 VITALS — BP 114/72 | HR 78 | Wt 145.0 lb

## 2024-05-18 DIAGNOSIS — N951 Menopausal and female climacteric states: Secondary | ICD-10-CM

## 2024-05-18 MED ORDER — PROGESTERONE MICRONIZED 100 MG PO CAPS: 100.0000 mg | ORAL_CAPSULE | Freq: Every evening | ORAL | 1 refills | Status: AC

## 2024-05-18 MED ORDER — DOTTI 0.025 MG/24HR TD PTTW
1.0000 | MEDICATED_PATCH | TRANSDERMAL | 1 refills | Status: DC
Start: 2024-05-19 — End: 2024-05-31

## 2024-05-18 NOTE — Progress Notes (Signed)
   Sarah George [birth year] [id number]   History:  53 y.o. G0 here to follow up after starting HRT. Doing much better since starting. Denies any bleeding. Symptoms are controlled. First month was given dotti  brand patches and did fine, next month she was given generic and has had a rash and itching. Would like dotti  only.   Was seen 8 weeks ago for HRT consult. Complained of perimenopause symptoms x's years. Complains of decreased energy, mood swings, trouble sleeping. Not hot flashes or night sweats. Some joint pain. Was on micronor  to control bleeding with fibroids. Planning for hysterectomy with Dr Cleotilde. Stopped IM testosterone prescribed by [facility] edge due to right nephrectomy 09/2023 (renal cell carcinoma). Hx uterine fibroids (failed endometrial ablation this years, due to fibroids. Most recent labs done 02/29/24 ([facility]) Estradiol : 47.4 FSH 65.7 Testosterone 11  Gynecologic History Patient's last menstrual period was 09/19/2023 (approximate).   Last Pap: 12/21/23. Results were: normal Last mammogram: 09/15/23. Results were: normal  Obstetric History OB History  Gravida Para Term Preterm AB Living  0 0 0 0 0 0  SAB IAB Ectopic Multiple Live Births  0 0 0 0        The following portions of the patient's history were reviewed and updated as appropriate: allergies, current medications, past family history, past medical history, past social history, past surgical history, and problem list.  Review of Systems  All other systems reviewed and are negative.   Past medical history, past surgical history, family history and social history were all reviewed and documented in the EPIC chart.  Exam:  Vitals:   05/18/24 1057  BP: 114/72  Pulse: 78  SpO2: 99%  Weight: 145 lb (65.8 kg)   Body mass index is 28.08 kg/m.  Physical Exam Constitutional:      Appearance: Normal appearance.  Pulmonary:     Effort: Pulmonary effort is normal.  Neurological:     Mental Status:  She is alert.  Psychiatric:        Mood and Affect: Mood normal.        Thought Content: Thought content normal.        Judgment: Judgment normal.      Sarah George, CMA present for exam  Assessment/Plan:   1. Perimenopausal (Primary) Continue current regimen. Will discuss increase of estrogen as needed after hyst.  - DOTTI  0.025 MG/24HR; Place 1 patch onto the skin 2 (two) times a week.  Dispense: 24 patch; Refill: 1 - progesterone  (PROMETRIUM ) 100 MG capsule; Take 1 capsule (100 mg total) by mouth at bedtime.  Dispense: 90 capsule; Refill: 1     Sarah George WHNP-BC 11:27 AM 05/18/2024

## 2024-05-18 NOTE — Telephone Encounter (Signed)
 Called patient to see if she's available for surgery w/ Dr. Cleotilde on 06/20/24 at [facility] at 12 pm. Patient agreed to surgery details and pre-op instructions were provided by phone.

## 2024-05-20 ENCOUNTER — Encounter (HOSPITAL_BASED_OUTPATIENT_CLINIC_OR_DEPARTMENT_OTHER): Payer: Self-pay

## 2024-05-21 ENCOUNTER — Other Ambulatory Visit (HOSPITAL_BASED_OUTPATIENT_CLINIC_OR_DEPARTMENT_OTHER): Payer: Self-pay | Admitting: Obstetrics & Gynecology

## 2024-05-21 DIAGNOSIS — Z8742 Personal history of other diseases of the female genital tract: Secondary | ICD-10-CM

## 2024-05-31 ENCOUNTER — Other Ambulatory Visit: Payer: Self-pay | Admitting: Radiology

## 2024-05-31 DIAGNOSIS — N951 Menopausal and female climacteric states: Secondary | ICD-10-CM

## 2024-05-31 MED ORDER — BIJUVA 0.5-100 MG PO CAPS: 1.0000 | ORAL_CAPSULE | Freq: Every evening | ORAL | 1 refills | Status: DC

## 2024-06-15 DIAGNOSIS — M25522 Pain in left elbow: Secondary | ICD-10-CM | POA: Diagnosis not present

## 2024-06-15 NOTE — Progress Notes (Signed)
 Surgical Instructions   Your procedure is scheduled on MONDAY June 20, 2024. Report to [facility] Main Entrance A at 10:10 A.M., then check in with the Admitting office. Any questions or running late day of surgery: call [phone]  Questions prior to your surgery date: call [phone], Monday-Friday, 8am-4pm. If you experience any cold or flu symptoms such as cough, fever, chills, shortness of breath, etc. between now and your scheduled surgery, please notify us  at the above number.     Remember:  Do not eat after midnight the night before your surgery   You may drink clear liquids until 9:10 the morning of your surgery.   Clear liquids allowed are: Water, Non-Citrus Juices (without pulp), Carbonated Beverages, Clear Tea (no milk, honey, etc.), Black Coffee Only (NO MILK, CREAM OR POWDERED CREAMER of any kind), and Gatorade.    Take these medicines the morning of surgery with A SIP OF WATER  norethindrone  (MICRONOR )   May take these medicines IF NEEDED: pantoprazole (PROTONIX)    One week prior to surgery, STOP taking any Aspirin (unless otherwise instructed by your surgeon) Aleve, Naproxen, Ibuprofen , Motrin , Advil , Goody's, BC's, all herbal medications, fish oil, and non-prescription vitamins.                     Do NOT Smoke (Tobacco/Vaping) for 24 hours prior to your procedure.  If you use a CPAP at night, you may bring your mask/headgear for your overnight stay.   You will be asked to remove any contacts, glasses, piercing's, hearing aid's, dentures/partials prior to surgery. Please bring cases for these items if needed.    Patients discharged the day of surgery will not be allowed to drive home, and someone needs to stay with them for 24 hours.  SURGICAL WAITING ROOM VISITATION Patients may have no more than 2 support people in the waiting area - these visitors may rotate.   Pre-op nurse will coordinate an appropriate time for 1 ADULT support person, who may not  rotate, to accompany patient in pre-op.  Children under the age of 17 must have an adult with them who is not the patient and must remain in the main waiting area with an adult.  If the patient needs to stay at the hospital during part of their recovery, the visitor guidelines for inpatient rooms apply.  Please refer to the [facility] website for the visitor guidelines for any additional information.   If you received a COVID test during your pre-op visit  it is requested that you wear a mask when out in public, stay away from anyone that may not be feeling well and notify your surgeon if you develop symptoms. If you have been in contact with anyone that has tested positive in the last 10 days please notify you surgeon.      Pre-operative CHG Bathing Instructions   You can play a key role in reducing the risk of infection after surgery. Your skin needs to be as free of germs as possible. You can reduce the number of germs on your skin by washing with CHG (chlorhexidine  gluconate) soap before surgery. CHG is an antiseptic soap that kills germs and continues to kill germs even after washing.   DO NOT use if you have an allergy to chlorhexidine /CHG or antibacterial soaps. If your skin becomes reddened or irritated, stop using the CHG and notify one of our RNs at [phone].  TAKE A SHOWER THE NIGHT BEFORE SURGERY   Please keep in mind the following:  DO NOT shave, including legs and underarms, 48 hours prior to surgery.   You may shave your face before/day of surgery.  Place clean sheets on your bed the night before surgery Use a clean washcloth (not used since being washed) for shower. DO NOT sleep with pet's night before surgery.  CHG Shower Instructions:  Wash your face and private area with normal soap. If you choose to wash your hair, wash first with your normal shampoo.  After you use shampoo/soap, rinse your hair and body thoroughly to remove shampoo/soap residue.   Turn the water OFF and apply half the bottle of CHG soap to a CLEAN washcloth.  Apply CHG soap ONLY FROM YOUR NECK DOWN TO YOUR TOES (washing for 3-5 minutes)  DO NOT use CHG soap on face, private areas, open wounds, or sores.  Pay special attention to the area where your surgery is being performed.  If you are having back surgery, having someone wash your back for you may be helpful. Wait 2 minutes after CHG soap is applied, then you may rinse off the CHG soap.  Pat dry with a clean towel  Put on clean pajamas    Additional instructions for the day of surgery: If you choose, you may shower the morning of surgery with an antibacterial soap.  DO NOT APPLY any lotions, deodorants, cologne, or perfumes.   Do not wear jewelry or makeup Do not wear nail polish, gel polish, artificial nails, or any other type of covering on natural nails (fingers and toes) Do not bring valuables to the hospital. [facility] is not responsible for valuables/personal belongings. Put on clean/comfortable clothes.  Please brush your teeth.  Ask your nurse before applying any prescription medications to the skin.

## 2024-06-16 ENCOUNTER — Other Ambulatory Visit: Payer: Self-pay

## 2024-06-16 ENCOUNTER — Encounter (HOSPITAL_COMMUNITY): Payer: Self-pay

## 2024-06-16 ENCOUNTER — Encounter (HOSPITAL_COMMUNITY)
Admission: RE | Admit: 2024-06-16 | Discharge: 2024-06-16 | Disposition: A | Discharge status: Home / Self Care | Source: Ambulatory Visit | Attending: Obstetrics & Gynecology | Admitting: Obstetrics & Gynecology

## 2024-06-16 VITALS — BP 115/79 | HR 75 | Temp 98.2°F | Resp 18 | Ht 60.0 in | Wt 146.0 lb

## 2024-06-16 DIAGNOSIS — Z01818 Encounter for other preprocedural examination: Secondary | ICD-10-CM

## 2024-06-16 DIAGNOSIS — Z01812 Encounter for preprocedural laboratory examination: Secondary | ICD-10-CM | POA: Insufficient documentation

## 2024-06-16 LAB — BASIC METABOLIC PANEL WITH GFR
Anion gap: 8 (ref 5–15)
BUN: 15 mg/dL (ref 6–20)
CO2: 26 mmol/L (ref 22–32)
Calcium: 8.7 mg/dL — ABNORMAL LOW (ref 8.9–10.3)
Chloride: 102 mmol/L (ref 98–111)
Creatinine, Ser: 1.12 mg/dL — ABNORMAL HIGH (ref 0.44–1.00)
GFR, Estimated: 60 mL/min — ABNORMAL LOW (ref >60)
Glucose, Bld: 95 mg/dL (ref 70–99)
Potassium: 3.8 mmol/L (ref 3.5–5.1)
Sodium: 136 mmol/L (ref 135–145)

## 2024-06-16 LAB — CBC
HCT: 38.8 % (ref 36.0–46.0)
Hemoglobin: 13.6 g/dL (ref 12.0–15.0)
MCH: 31.6 pg (ref 26.0–34.0)
MCHC: 35.1 g/dL (ref 30.0–36.0)
MCV: 90.2 fL (ref 80.0–100.0)
Platelets: 196 K/uL (ref 150–400)
RBC: 4.3 MIL/uL (ref 3.87–5.11)
RDW: 12.6 % (ref 11.5–15.5)
WBC: 8.9 K/uL (ref 4.0–10.5)
nRBC: 0 % (ref 0.0–0.2)

## 2024-06-16 NOTE — Progress Notes (Signed)
 PCP - Virginia  Miller,PA Cardiologist - denies  PPM/ICD - denies Device Orders -  Rep Notified -   Chest x-ray - na EKG - na Stress Test - denies ECHO - denies Cardiac Cath - denies  Sleep Study - denies CPAP - no  Fasting Blood Sugar - na Checks Blood Sugar _____ times a day  Last dose of GLP1 agonist-  na GLP1 instructions: na  Blood Thinner Instructions:na Aspirin Instructions:na  ERAS Protcol -clears until 0910 PRE-SURGERY Ensure or G2- no  COVID TEST- no   Anesthesia review: no  Patient denies shortness of breath, fever, cough and chest pain at PAT appointment   All instructions explained to the patient, with a verbal understanding of the material. Patient agrees to go over the instructions while at home for a better understanding.  The opportunity to ask questions was provided.

## 2024-06-20 ENCOUNTER — Ambulatory Visit (HOSPITAL_COMMUNITY): Admitting: Anesthesiology

## 2024-06-20 ENCOUNTER — Encounter: Payer: Self-pay | Admitting: Radiology

## 2024-06-20 ENCOUNTER — Other Ambulatory Visit (HOSPITAL_BASED_OUTPATIENT_CLINIC_OR_DEPARTMENT_OTHER): Payer: Self-pay | Admitting: Obstetrics & Gynecology

## 2024-06-20 ENCOUNTER — Ambulatory Visit (HOSPITAL_COMMUNITY)
Admission: RE | Admit: 2024-06-20 | Discharge: 2024-06-20 | Disposition: A | Discharge status: Home / Self Care | Attending: Obstetrics & Gynecology | Admitting: Obstetrics & Gynecology

## 2024-06-20 ENCOUNTER — Other Ambulatory Visit: Payer: Self-pay

## 2024-06-20 ENCOUNTER — Encounter (HOSPITAL_COMMUNITY): Payer: Self-pay | Admitting: Obstetrics & Gynecology

## 2024-06-20 ENCOUNTER — Encounter (HOSPITAL_COMMUNITY)
Admission: RE | Disposition: A | Discharge status: Home / Self Care | Payer: Self-pay | Source: Home / Self Care | Attending: Obstetrics & Gynecology

## 2024-06-20 ENCOUNTER — Other Ambulatory Visit (HOSPITAL_COMMUNITY): Payer: Self-pay

## 2024-06-20 DIAGNOSIS — N8003 Adenomyosis of the uterus: Secondary | ICD-10-CM

## 2024-06-20 DIAGNOSIS — N879 Dysplasia of cervix uteri, unspecified: Secondary | ICD-10-CM | POA: Diagnosis not present

## 2024-06-20 DIAGNOSIS — N72 Inflammatory disease of cervix uteri: Secondary | ICD-10-CM | POA: Diagnosis not present

## 2024-06-20 DIAGNOSIS — N8311 Corpus luteum cyst of right ovary: Secondary | ICD-10-CM | POA: Diagnosis not present

## 2024-06-20 DIAGNOSIS — D250 Submucous leiomyoma of uterus: Secondary | ICD-10-CM

## 2024-06-20 DIAGNOSIS — D259 Leiomyoma of uterus, unspecified: Secondary | ICD-10-CM | POA: Diagnosis present

## 2024-06-20 DIAGNOSIS — D219 Benign neoplasm of connective and other soft tissue, unspecified: Secondary | ICD-10-CM | POA: Diagnosis present

## 2024-06-20 DIAGNOSIS — D251 Intramural leiomyoma of uterus: Secondary | ICD-10-CM

## 2024-06-20 DIAGNOSIS — Z905 Acquired absence of kidney: Secondary | ICD-10-CM | POA: Diagnosis not present

## 2024-06-20 DIAGNOSIS — N831 Corpus luteum cyst of ovary, unspecified side: Secondary | ICD-10-CM | POA: Diagnosis not present

## 2024-06-20 DIAGNOSIS — Z01818 Encounter for other preprocedural examination: Secondary | ICD-10-CM

## 2024-06-20 DIAGNOSIS — K219 Gastro-esophageal reflux disease without esophagitis: Secondary | ICD-10-CM | POA: Diagnosis not present

## 2024-06-20 DIAGNOSIS — N83201 Unspecified ovarian cyst, right side: Secondary | ICD-10-CM

## 2024-06-20 DIAGNOSIS — N838 Other noninflammatory disorders of ovary, fallopian tube and broad ligament: Secondary | ICD-10-CM | POA: Diagnosis not present

## 2024-06-20 DIAGNOSIS — Z85528 Personal history of other malignant neoplasm of kidney: Secondary | ICD-10-CM | POA: Insufficient documentation

## 2024-06-20 HISTORY — PX: TOTAL LAPAROSCOPIC HYSTERECTOMY WITH SALPINGECTOMY: SHX6742

## 2024-06-20 HISTORY — PX: CYSTOSCOPY: SHX5120

## 2024-06-20 HISTORY — PX: LAPAROSCOPIC OVARIAN CYSTECTOMY: SHX6248

## 2024-06-20 LAB — POCT PREGNANCY, URINE: Preg Test, Ur: NEGATIVE

## 2024-06-20 SURGERY — HYSTERECTOMY, TOTAL, LAPAROSCOPIC, WITH SALPINGECTOMY
Anesthesia: General | Site: Pelvis | Laterality: Right

## 2024-06-20 MED ORDER — PROPOFOL 10 MG/ML IV BOLUS
INTRAVENOUS | Status: AC
  Filled 2024-06-20: qty 20

## 2024-06-20 MED ORDER — PHENYLEPHRINE HCL-NACL 20-0.9 MG/250ML-% IV SOLN
INTRAVENOUS | Status: DC | PRN
  Administered 2024-06-20: 15 ug/min via INTRAVENOUS

## 2024-06-20 MED ORDER — ROCURONIUM BROMIDE 10 MG/ML (PF) SYRINGE
PREFILLED_SYRINGE | INTRAVENOUS | Status: AC
  Filled 2024-06-20: qty 10

## 2024-06-20 MED ORDER — SODIUM CHLORIDE 0.9 % IV SOLN
2.0000 g | INTRAVENOUS | Status: AC
  Administered 2024-06-20: 2 g via INTRAVENOUS
  Filled 2024-06-20: qty 2

## 2024-06-20 MED ORDER — ACETAMINOPHEN 500 MG PO TABS
1000.0000 mg | ORAL_TABLET | ORAL | Status: AC
  Administered 2024-06-20: 1000 mg via ORAL
  Filled 2024-06-20: qty 2

## 2024-06-20 MED ORDER — ORAL CARE MOUTH RINSE: 15.0000 mL | Freq: Once | OROMUCOSAL | Status: AC

## 2024-06-20 MED ORDER — LIDOCAINE 2% (20 MG/ML) 5 ML SYRINGE
INTRAMUSCULAR | Status: DC | PRN
  Administered 2024-06-20: 60 mg via INTRAVENOUS

## 2024-06-20 MED ORDER — SCOPOLAMINE 1 MG/3DAYS TD PT72
MEDICATED_PATCH | TRANSDERMAL | Status: AC
  Administered 2024-06-20: 1 mg via TRANSDERMAL
  Filled 2024-06-20: qty 1

## 2024-06-20 MED ORDER — ENOXAPARIN SODIUM 40 MG/0.4ML IJ SOSY
40.0000 mg | PREFILLED_SYRINGE | INTRAMUSCULAR | Status: AC
  Administered 2024-06-20: 40 mg via SUBCUTANEOUS
  Filled 2024-06-20: qty 0.4

## 2024-06-20 MED ORDER — CHLORHEXIDINE GLUCONATE 0.12 % MT SOLN
15.0000 mL | Freq: Once | OROMUCOSAL | Status: AC
  Administered 2024-06-20: 15 mL via OROMUCOSAL
  Filled 2024-06-20: qty 15

## 2024-06-20 MED ORDER — 0.9 % SODIUM CHLORIDE (POUR BTL) OPTIME
TOPICAL | Status: DC | PRN
  Administered 2024-06-20: 1000 mL

## 2024-06-20 MED ORDER — AMISULPRIDE (ANTIEMETIC) 5 MG/2ML IV SOLN: 10.0000 mg | Freq: Once | INTRAVENOUS | Status: DC | PRN

## 2024-06-20 MED ORDER — OXYCODONE HCL 5 MG/5ML PO SOLN: 5.0000 mg | Freq: Once | ORAL | Status: DC | PRN

## 2024-06-20 MED ORDER — GABAPENTIN 100 MG PO CAPS
200.0000 mg | ORAL_CAPSULE | ORAL | Status: AC
  Administered 2024-06-20: 200 mg via ORAL
  Filled 2024-06-20: qty 2

## 2024-06-20 MED ORDER — ROCURONIUM BROMIDE 10 MG/ML (PF) SYRINGE
PREFILLED_SYRINGE | INTRAVENOUS | Status: DC | PRN
  Administered 2024-06-20: 40 mg via INTRAVENOUS
  Administered 2024-06-20: 20 mg via INTRAVENOUS

## 2024-06-20 MED ORDER — OXYCODONE HCL 5 MG PO TABS: 5.0000 mg | ORAL_TABLET | Freq: Once | ORAL | Status: DC | PRN

## 2024-06-20 MED ORDER — SCOPOLAMINE 1 MG/3DAYS TD PT72
1.0000 | MEDICATED_PATCH | TRANSDERMAL | Status: DC
Start: 2024-06-20 — End: 2024-06-20

## 2024-06-20 MED ORDER — LIDOCAINE 2% (20 MG/ML) 5 ML SYRINGE
INTRAMUSCULAR | Status: AC
  Filled 2024-06-20: qty 5

## 2024-06-20 MED ORDER — ONDANSETRON HCL 4 MG/2ML IJ SOLN
INTRAMUSCULAR | Status: DC | PRN
  Administered 2024-06-20: 4 mg via INTRAVENOUS

## 2024-06-20 MED ORDER — DIPHENHYDRAMINE HCL 50 MG/ML IJ SOLN
INTRAMUSCULAR | Status: DC | PRN
  Administered 2024-06-20: 12.5 mg via INTRAVENOUS

## 2024-06-20 MED ORDER — OXYCODONE HCL 5 MG PO TABS
5.0000 mg | ORAL_TABLET | ORAL | 0 refills | Status: AC | PRN
  Filled 2024-06-20: qty 30, 5d supply, fill #0

## 2024-06-20 MED ORDER — SODIUM CHLORIDE 0.9 % IV SOLN
INTRAVENOUS | Status: DC | PRN
  Administered 2024-06-20: 60 mL

## 2024-06-20 MED ORDER — POVIDONE-IODINE 10 % EX SWAB
2.0000 | Freq: Once | CUTANEOUS | Status: AC
  Administered 2024-06-20: 2 via TOPICAL

## 2024-06-20 MED ORDER — GABAPENTIN 100 MG PO CAPS
100.0000 mg | ORAL_CAPSULE | Freq: Three times a day (TID) | ORAL | 0 refills | Status: DC
  Filled 2024-06-20: qty 30, 10d supply, fill #0

## 2024-06-20 MED ORDER — SODIUM CHLORIDE 0.9 % IR SOLN
Status: DC | PRN
  Administered 2024-06-20: 1000 mL

## 2024-06-20 MED ORDER — PROPOFOL 10 MG/ML IV BOLUS
INTRAVENOUS | Status: DC | PRN
  Administered 2024-06-20: 100 mg via INTRAVENOUS

## 2024-06-20 MED ORDER — SUGAMMADEX SODIUM 200 MG/2ML IV SOLN
INTRAVENOUS | Status: DC | PRN
  Administered 2024-06-20: 200 mg via INTRAVENOUS

## 2024-06-20 MED ORDER — FENTANYL CITRATE (PF) 250 MCG/5ML IJ SOLN
INTRAMUSCULAR | Status: DC | PRN
  Administered 2024-06-20: 100 ug via INTRAVENOUS
  Administered 2024-06-20 (×2): 50 ug via INTRAVENOUS

## 2024-06-20 MED ORDER — LACTATED RINGERS IV SOLN: INTRAVENOUS | Status: DC | PRN

## 2024-06-20 MED ORDER — FENTANYL CITRATE (PF) 100 MCG/2ML IJ SOLN: 25.0000 ug | INTRAMUSCULAR | Status: DC | PRN

## 2024-06-20 MED ORDER — ONDANSETRON HCL 4 MG/2ML IJ SOLN
INTRAMUSCULAR | Status: AC
  Filled 2024-06-20: qty 2

## 2024-06-20 MED ORDER — MIDAZOLAM HCL (PF) 2 MG/2ML IJ SOLN
INTRAMUSCULAR | Status: DC | PRN
  Administered 2024-06-20: 2 mg via INTRAVENOUS

## 2024-06-20 MED ORDER — DEXAMETHASONE SOD PHOSPHATE PF 10 MG/ML IJ SOLN
INTRAMUSCULAR | Status: DC | PRN
  Administered 2024-06-20: 4 mg via INTRAVENOUS

## 2024-06-20 MED ORDER — LACTATED RINGERS IV SOLN: INTRAVENOUS | Status: DC

## 2024-06-20 MED ORDER — PROPOFOL 500 MG/50ML IV EMUL
INTRAVENOUS | Status: DC | PRN
  Administered 2024-06-20: 125 ug/kg/min via INTRAVENOUS

## 2024-06-20 MED ORDER — FENTANYL CITRATE (PF) 250 MCG/5ML IJ SOLN
INTRAMUSCULAR | Status: AC
  Filled 2024-06-20: qty 5

## 2024-06-20 MED ORDER — ONDANSETRON 4 MG PO TBDP
4.0000 mg | ORAL_TABLET | Freq: Four times a day (QID) | ORAL | 0 refills | Status: DC | PRN
  Filled 2024-06-20: qty 18, 5d supply, fill #0

## 2024-06-20 MED ORDER — MIDAZOLAM HCL 2 MG/2ML IJ SOLN
INTRAMUSCULAR | Status: AC
  Filled 2024-06-20: qty 2

## 2024-06-20 MED ORDER — BUPIVACAINE HCL (PF) 0.25 % IJ SOLN
INTRAMUSCULAR | Status: DC | PRN
  Administered 2024-06-20: 10 mL

## 2024-06-20 MED ORDER — HEMOSTATIC AGENTS (NO CHARGE) OPTIME
TOPICAL | Status: DC | PRN
  Administered 2024-06-20: 1

## 2024-06-20 SURGICAL SUPPLY — 49 items
APPLICATOR ARISTA FLEXITIP XL (MISCELLANEOUS) IMPLANT
COVER MAYO STAND STRL (DRAPES) ×3 IMPLANT
COVER SURGICAL LIGHT HANDLE (MISCELLANEOUS) ×3 IMPLANT
DERMABOND ADVANCED .7 DNX12 (GAUZE/BANDAGES/DRESSINGS) ×3 IMPLANT
DRAPE SURG IRRIG POUCH 19X23 (DRAPES) ×3 IMPLANT
DURAPREP 26ML APPLICATOR (WOUND CARE) ×3 IMPLANT
GLOVE BIO SURGEON STRL SZ 6.5 (GLOVE) ×3 IMPLANT
GLOVE BIOGEL PI IND STRL 6.5 (GLOVE) ×3 IMPLANT
GLOVE ECLIPSE 6.5 STRL STRAW (GLOVE) ×6 IMPLANT
GLOVE SURG UNDER POLY LF SZ7 (GLOVE) ×12 IMPLANT
GOWN STRL REUS W/ TWL LRG LVL3 (GOWN DISPOSABLE) ×12 IMPLANT
GOWN STRL REUS W/ TWL XL LVL3 (GOWN DISPOSABLE) ×3 IMPLANT
HEMOSTAT ARISTA ABSORB 3G PWDR (HEMOSTASIS) IMPLANT
HIBICLENS CHG 4% 4OZ BTL (MISCELLANEOUS) ×3 IMPLANT
IRRIGATION SUCT STRKRFLW 2 WTP (MISCELLANEOUS) ×3 IMPLANT
KIT PINK PAD W/HEAD ARM REST (MISCELLANEOUS) ×3 IMPLANT
KIT TURNOVER KIT B (KITS) ×3 IMPLANT
LIGASURE VESSEL 5MM BLUNT TIP (ELECTROSURGICAL) ×3 IMPLANT
NDL INSUFFLATION 14GA 120MM (NEEDLE) ×3 IMPLANT
NEEDLE INSUFFLATION 14GA 120MM (NEEDLE) ×3 IMPLANT
OCCLUDER COLPOPNEUMO (BALLOONS) ×3 IMPLANT
PACK LAPAROSCOPY BASIN (CUSTOM PROCEDURE TRAY) ×3 IMPLANT
POUCH LAPAROSCOPIC INSTRUMENT (MISCELLANEOUS) ×3 IMPLANT
POWDER SURGICEL 3.0 GRAM (HEMOSTASIS) IMPLANT
SCISSORS LAP 5X35 DISP (ENDOMECHANICALS) ×3 IMPLANT
SET CYSTO IRRIGATION (SET/KITS/TRAYS/PACK) ×3 IMPLANT
SET TRI-LUMEN FLTR TB AIRSEAL (TUBING) ×3 IMPLANT
SET TUBE SMOKE EVAC HIGH FLOW (TUBING) ×3 IMPLANT
SHEARS HARMONIC 36 ACE (MISCELLANEOUS) ×3 IMPLANT
SLEEVE SCD COMPRESS KNEE MED (STOCKING) ×3 IMPLANT
SOLN 0.9% NACL POUR BTL 1000ML (IV SOLUTION) ×3 IMPLANT
SUT VIC AB 0 CT1 27XBRD ANBCTR (SUTURE) ×6 IMPLANT
SUT VIC AB 4-0 PS2 18 (SUTURE) ×6 IMPLANT
SUT VLOC 180 0 9IN GS21 (SUTURE) ×3 IMPLANT
SYR 10ML LL (SYRINGE) ×3 IMPLANT
SYR 50ML LL SCALE MARK (SYRINGE) ×6 IMPLANT
TIP ENDOSCOPIC SURGICEL (TIP) IMPLANT
TIP UTERINE 5.1X6CM LAV DISP (MISCELLANEOUS) IMPLANT
TIP UTERINE 6.7X10CM GRN DISP (MISCELLANEOUS) IMPLANT
TIP UTERINE 6.7X8CM BLUE DISP (MISCELLANEOUS) IMPLANT
TOWEL GREEN STERILE FF (TOWEL DISPOSABLE) ×6 IMPLANT
TRAY FOLEY W/BAG SLVR 14FR (SET/KITS/TRAYS/PACK) ×3 IMPLANT
TROCAR ADV FIXATION 5X100MM (TROCAR) ×3 IMPLANT
TROCAR KII 8X100ML NONTHREADED (TROCAR) ×3 IMPLANT
TROCAR PORT AIRSEAL 5X120 (TROCAR) ×3 IMPLANT
TROCAR XCEL NON-BLD 5MMX100MML (ENDOMECHANICALS) ×3 IMPLANT
TROCAR Z-THREAD OPTICAL 5X100M (TROCAR) IMPLANT
UNDERPAD 30X36 HEAVY ABSORB (UNDERPADS AND DIAPERS) ×3 IMPLANT
WARMER LAPAROSCOPE (MISCELLANEOUS) ×3 IMPLANT

## 2024-06-20 NOTE — H&P (Signed)
 Sarah George is an 53 y.o. female G0 with hx of menorrhagia due to fibroid uterus and failed endometrial ablation x 2 who is here for definitive treatment with hysterectomy.  She has a submucosal fibroid and slightly enlarged uterus.  Ovaries are normal.  Pt's hx is significant for renal cell carcinoma treated earlier this year with surgery in March.  Margins were all negative and tumor was limited to the kidney.  She was advised to wait for any procedure for about three months.  Pt's creatinine has been elevated since surgery but most recently was 1.2.  Procedure and risks and benefits reviewed with pt.  Her two aunts were with her as well.  Risks discussed including but not limited to bleeding, 1% risk of receiving a  transfusion, infection, 3-4% risk of bowel/bladder/ureteral/vascular injury discussed as well as possible need for additional surgery if injury does occur discussed.  DVT/PE and rare risk of death discussed.  My actual complications with prior surgeries discussed.  Vaginal cuff dehiscence discussed.  Hernia formation discussed.  Positioning and incision locations discussed.  Patient aware if pathology abnormal she may need additional treatment.  All questions answered.    She did have negative endometrial biopsy 01/20/2024.  Pt is aware that my biggest concern for surgery is the left ureter.  We discussed possible supracervical is there is a concern about location of ureter on the left.  She is comfortable with this if I feel this is the best procedure for her.    Pertinent Gynecological History: Menses: improved with micronor  but heavy when not on it Contraception: micronor  DES exposure: denies Blood transfusions: none Sexually transmitted diseases: no past history Previous GYN Procedures: laparoscopic with removal of ovarian fibroidin 2019, hysteroscopy with failed ablation x 2 25Last mammogram: normal Date: 1/30/200 Last pap: normal Date: 12/21/2023 OB History: G0, P0   Menstrual  History: Patient's last menstrual period was 11/17/2023.    Past Medical History:  Diagnosis Date   Anemia    Anxiety    Deviated nasal septum    Family history of adverse reaction to anesthesia    mom with PONV   Fibroids, submucosal 04/14/2018   several fibroids measuring 8 x 8mm, 2.3 x 2.3cm, 8 x 6mm, and 10 x 7mm   GERD (gastroesophageal reflux disease)    History of abnormal Pap smear 09/1991   LEEP, CIN I   History of spider veins    Left cataract    History of    Lipoma 03/24/2014   Left supraclavicular   PONV (postoperative nausea and vomiting)    Renal cell carcinoma (HCC)    06/2023    Past Surgical History:  Procedure Laterality Date   ABDOMINOPLASTY  07/17/2021   in [place]   CATARACT EXTRACTION Left 2012   DILATATION & CURETTAGE/HYSTEROSCOPY WITH MYOSURE N/A 05/03/2018   Procedure: DILATATION & CURETTAGE/HYSTEROSCOPY WITH MYOSURE;  Surgeon: Cleotilde Ronal RAMAN, MD;  Location: [facility] [place];  Service: Gynecology;  Laterality: N/A;  Fibroid resection   DILITATION & CURRETTAGE/HYSTROSCOPY WITH HYDROTHERMAL ABLATION N/A 11/18/2022   Procedure: DILATATION & CURETTAGE/HYSTEROSCOPY WITH MYOSURE RESECTION OF FIBROID, ENDOMETRIAL BIOPSY.;  Surgeon: Cleotilde Ronal RAMAN, MD;  Location: [facility] OR;  Service: Gynecology;  Laterality: N/A;   HYSTEROSCOPY WITH D & C N/A 02/01/2024   Procedure: HYSTEROSCOPY;  Surgeon: Cleotilde Ronal RAMAN, MD;  Location: [facility] OR;  Service: Gynecology;  Laterality: N/A;  [employer] rep will be at bedside   LAPAROSCOPIC OVARIAN CYSTECTOMY Left 05/03/2018  Procedure: LAPAROSCOPIC OVARIAN CYSTECTOMY  possible LSO;  Surgeon: Cleotilde Ronal RAMAN, MD;  Location: [facility];  Service: Gynecology;  Laterality: Left;  Possible LSO   LEEP  1993   CIN I   RHINOPLASTY  1997   ROBOT ASSISTED LAPAROSCOPIC NEPHRECTOMY Right 09/23/2023   Procedure: XI RIGHT ROBOTIC ASSISTED RADICAL LAPAROSCOPIC NEPHRECTOMY;  Surgeon: Alvaro Ricardo KATHEE Mickey., MD;  Location: [facility]  ORS;  Service: Urology;  Laterality: Right;   Sclerotherapy  11/06/2015   Septorhinoplasty  01/23/2005   TONSILLECTOMY  06/2013    Family History  Problem Relation Age of Onset   Colon cancer Mother 57 - 58   Prostate cancer Father 3 - 58       metastatic   Breast cancer Maternal Aunt 72 - 59   Colon cancer Maternal Uncle    Colon cancer Maternal Grandfather    Prostate cancer Paternal Grandfather    Breast cancer Maternal Cousin 39 - 39   Prostate cancer Paternal Cousin 28 - 23    Social History:  reports that she has never smoked. She has never been exposed to tobacco smoke. She has never used smokeless tobacco. She reports current alcohol use. She reports that she does not use drugs.  Allergies:  Allergies  Allergen Reactions   Nsaids Other (See Comments)    Gastritis Possible ulcers    Topamax [Topiramate] Other (See Comments)    Agitation Insomnia    Dotti  [Estradiol ] Itching, Dermatitis and Rash    Reaction to the transdermal patch - possibly just the adhesive     Medications Prior to Admission  Medication Sig Dispense Refill Last Dose/Taking   Ascorbic Acid (VITAMIN C DROPS MT) Place 2 drops under the tongue daily.   Past Week   BERBERINE CHLORIDE PO Take 2 tablets by mouth daily.   Past Month   Brimonidine Tartrate (LUMIFY OP) Place 1 drop into both eyes daily as needed (eye irritation, allergies).   Past Week   Cyanocobalamin  (VITAMIN B-12 SL) Place 1 drop under the tongue daily as needed (energy).   Past Month   Multiple Vitamins-Minerals (MULTIVITAMIN WOMEN) TABS Take 1 tablet by mouth daily.   Past Week   norethindrone  (MICRONOR ) 0.35 MG tablet TAKE 1 TABLET BY MOUTH EVERY DAY 84 tablet 1 06/19/2024   Omega-3 Fatty Acids (FISH OIL PO) Take 1 capsule by mouth daily.   Past Week   OVER THE COUNTER MEDICATION Apply 1 spray topically See admin instructions. OTC magnesium topical spray - apply 1 spray to each foot at bedtime.   Taking   progesterone  (PROMETRIUM ) 100  MG capsule Take 1 capsule (100 mg total) by mouth at bedtime. 90 capsule 1 Unknown   Propylene Glycol (SYSTANE COMPLETE PF OP) Place 1 drop into both eyes daily.   Past Week   TURMERIC CURCUMIN PO Place 2 drops under the tongue daily.   Past Week   Estradiol -Progesterone  (BIJUVA) 0.5-100 MG CAPS Take 1 capsule by mouth at bedtime. (Patient not taking: Reported on 06/16/2024) 30 capsule 1 Not Taking   loratadine (CLARITIN) 10 MG tablet Take 10 mg by mouth daily as needed for allergies.   More than a month   pantoprazole (PROTONIX) 40 MG tablet Take 40 mg by mouth daily as needed (acid reflux).   More than a month    Review of Systems  Constitutional: Negative.   Respiratory: Negative.    Cardiovascular: Negative.   Psychiatric/Behavioral: Negative.      Blood pressure 134/76, pulse  83, temperature 98.6 F (37 C), temperature source Oral, resp. rate 18, height 5' (1.524 m), weight 65.8 kg, last menstrual period 11/17/2023, SpO2 100%. Physical Exam Constitutional:      Appearance: Normal appearance.  Cardiovascular:     Rate and Rhythm: Normal rate and regular rhythm.  Pulmonary:     Effort: Pulmonary effort is normal.     Breath sounds: Normal breath sounds.  Abdominal:     General: Abdomen is flat. Bowel sounds are normal.     Palpations: Abdomen is soft.     Comments: Prior incisions noted  Neurological:     General: No focal deficit present.     Mental Status: She is alert.  Psychiatric:        Mood and Affect: Mood normal.        Behavior: Behavior normal.     Results for orders placed or performed during the hospital encounter of 06/20/24 (from the past 24 hours)  Pregnancy, urine POC     Status: None   Collection Time: 06/20/24 10:28 AM  Result Value Ref Range   Preg Test, Ur NEGATIVE NEGATIVE    No results found.  Assessment/Plan: 53 yo G0 SWF with h/o menorrhagia, submucosal fibroid here for definitive surgery with total laparoscopic hysterectomy, bilateral  salpingectomy, possible oophorectomy, cystoscopy.  Questions answered.  Pt ready to proceed.    Ronal GORMAN Pinal 06/20/2024, 11:31 AM

## 2024-06-20 NOTE — Anesthesia Preprocedure Evaluation (Signed)
 Anesthesia Evaluation  Patient identified by MRN, date of birth, ID band Patient awake    Reviewed: Allergy & Precautions, NPO status , Patient's Chart, lab work & pertinent test results  History of Anesthesia Complications (+) PONV and history of anesthetic complications  Airway Mallampati: II  TM Distance: >3 FB Neck ROM: Full    Dental  (+) Dental Advisory Given   Pulmonary neg pulmonary ROS   breath sounds clear to auscultation       Cardiovascular negative cardio ROS  Rhythm:Regular Rate:Normal     Neuro/Psych negative neurological ROS     GI/Hepatic Neg liver ROS,GERD  ,,  Endo/Other  negative endocrine ROS    Renal/GU Renal disease (S/p nephrectomy)     Musculoskeletal   Abdominal   Peds  Hematology negative hematology ROS (+)   Anesthesia Other Findings   Reproductive/Obstetrics (+) Pregnancy                              Anesthesia Physical Anesthesia Plan  ASA: 2  Anesthesia Plan: General   Post-op Pain Management: Tylenol  PO (pre-op)* and Toradol  IV (intra-op)*   Induction: Intravenous  PONV Risk Score and Plan: 4 or greater and Propofol  infusion, Dexamethasone , Ondansetron , Midazolam  and TIVA  Airway Management Planned: Oral ETT  Additional Equipment:   Intra-op Plan:   Post-operative Plan: Extubation in OR  Informed Consent: I have reviewed the patients History and Physical, chart, labs and discussed the procedure including the risks, benefits and alternatives for the proposed anesthesia with the patient or authorized representative who has indicated his/her understanding and acceptance.     Dental advisory given  Plan Discussed with: CRNA  Anesthesia Plan Comments:         Anesthesia Quick Evaluation

## 2024-06-20 NOTE — Anesthesia Procedure Notes (Signed)
 Procedure Name: Intubation Date/Time: 06/20/2024 12:00 PM  Performed by: Genny Gun, CRNAPre-anesthesia Checklist: Patient identified, Emergency Drugs available, Suction available, Patient being monitored and Timeout performed Patient Re-evaluated:Patient Re-evaluated prior to induction Oxygen Delivery Method: Circle system utilized Preoxygenation: Pre-oxygenation with 100% oxygen Induction Type: IV induction Ventilation: Mask ventilation without difficulty and Oral airway inserted - appropriate to patient size Laryngoscope Size: Mac and 3 Grade View: Grade II Tube type: Oral Tube size: 7.0 mm Number of attempts: 1 Placement Confirmation: ETT inserted through vocal cords under direct vision, positive ETCO2 and breath sounds checked- equal and bilateral Secured at: 21 cm Tube secured with: Tape Dental Injury: Teeth and Oropharynx as per pre-operative assessment

## 2024-06-20 NOTE — Care Plan (Signed)
 Call from PACU nurse.  Patient has been up and voided and walked.  Feeling really good.  No vaginal bleeding.   VSS/AF.  Abdomen soft.  Has received nothing for pain in the recovery room.  No nausea.  Would like to go home from PACU.  Does have family member that can stay with her tonight as well.  Family members have already gotten her post op pain medication.

## 2024-06-20 NOTE — Op Note (Addendum)
 06/20/2024  2:36 PM  PATIENT:  Sarah George  53 y.o. female  PRE-OPERATIVE DIAGNOSIS:  Fibroids, submucosal  POST-OPERATIVE DIAGNOSIS:  Fibroids, submucosal  PROCEDURE:  Procedure(s): HYSTERECTOMY, TOTAL, LAPAROSCOPIC, WITH SALPINGECTOMY CYSTOSCOPY EXCISION, CYST, OVARY, LAPAROSCOPIC  SURGEON:  Ronal GORMAN Pinal  ASSISTANTS: Jodine Quarry.  An experienced assistant was required given the standard of surgical care given the complexity of the case.  This assistant was needed for exposure, dissection, suctioning, retraction, instrument exchange and for overall help during the procedure.  RNFA help was also unavailable.  ANESTHESIA:   general  ESTIMATED BLOOD LOSS: 50 mL  BLOOD ADMINISTERED:none   FLUIDS: 1200cc LR  UOP: 300cc clear UOP  SPECIMEN:  uterus, cervix, bilateral fallopian tubes, right ovarian cyst  DISPOSITION OF SPECIMEN:  PATHOLOGY  FINDINGS: enlarged cyst on right ovary.  Large posterior fibroid, normal fallopian tubes, normal upper abdomen  DESCRIPTION OF OPERATION: Patient is taken to the operating room. She is placed in the supine position. She is a running IV in place. Informed consent was present on the chart. SCDs on her lower extremities and functioning properly. Patient was positioned while she was awake.  Her legs were placed in the low lithotomy position in [place] stirrups. Her arms were tucked by the side.  General endotracheal anesthesia was administered by the anesthesia staff without difficulty. Dr. Fitzgeral, anesthesia, oversaw case.  Time out performed.    Clora prep was then used to prep the abdomen and Hibiclens  was used to prep the inner thighs, perineum and vagina. Once 3 minutes had past the patient was draped in a normal standard fashion. The legs were lifted to the high lithotomy position. The cervix was visualized by placing a heavy weighted speculum in the posterior aspect of the vagina and using a curved Deaver retractor to the retract  anteriorly. The anterior lip of the cervix was grasped with single-tooth tenaculum.  The cervix sounded to 7 cm. Pratt dilators were used to dilate the cervix up to a #21. A RUMI uterine manipulator was obtained. A #6 disposable tip was placed on the RUMI manipulator as well as a 3.5, silver KOH ring. This was passed through the cervix and the bulb of the disposable tip was inflated with 2cc of normal saline.  There was resistance with inflating the bulb which may have been due to the presence of the intramural fibroid.  There was a good fit of the KOH ring around the cervix. The tenaculum was removed. There is also good manipulation of the uterus. The speculum and retractor were removed as well. A Foley catheter was placed to straight drain.  Clear urine was noted. Legs were lowered to the low lithotomy position and attention was turned the abdomen.  Left upper quadrant placement for the initial port was used due to prior surgical history.  Skin aneshatized 2cm beneath the costal margin in the midclavicular line.  Skin incision made with #11 blade.  Then a 5mm non bladed trochar and port were placed with direct entry technique and without difficulty.  An OG tube was placed prior to placement of this port to decompress the stomach.  Pneumoperitoneum was achieved without difficulty.  Upper abdomen and pelvis surveyed.  Findings included enlarged uterus with posterior fibroid, enlarged cyst on right ovary.  Locations for umbilical, RLQ, LLQ, and suprapubic ports were noted by transillumination of the abdominal wall.  0.25% marcaine  was used to anesthetize the skin.  8mm skin incision was made in the RLQ and an AirSeal  port was placed underdirect visualization of the laparoscope.  Then a 5mm skin incision was made at the umbilicus and RLQ.  5mm nonbladed trochars and ports were placed with direct visualization of the laparoscope.  Finally, and 8mm skin incision was made about 4cm above the pubic symphasis and an 8mm  non-bladed port was placed with direct visualization of the laparoscope.  All trochars were removed.    Ureter on the left was identified.  Pt has hx of right nephrectomy.  attention was turned to the left side. With uterus on stretch the left tube was excised off the ovary and mesosalpinx was dissected to free the tube. Then the left utero-ovarian pedicle was serially clamped cauterized and incised using the ligasure device. Left round ligament was serially clamped cauterized and incised. The anterior and posterior peritoneum of the inferior leaf of the broad ligament were opened. The beginning of the bladder flap was created.  The bladder was taken down below the level of the KOH ring. The left uterine artery skeletonized and then just superior to the KOH ring this vessel was serially clamped, cauterized, and incised.  Attention was turned the right side.  The uterus was placed on stretch to the opposite side.  The tube was excised off the ovary using sharp dissection a bipolar cautery.  The mesosalpinx was incised freeing the tube. The ovary was then elevated and the large cyst present was removed with the ligasure device.  Cyst wall sent to pathology.  Then the right uterine ovarian pedicle was serially clamped cauterized and incised. Next the right round ligament was serially clamped cauterized and incised. The anterior posterior peritoneum of the inferiorly for the broad ligament were opened. The anterior peritoneum was carried across to the dissection on the left side. The remainder of the bladder flap was created using sharp dissection. The bladder was well below the level of the KOH ring. The right uterine artery skeletonized. Then the right uterine artery, above the level of the KOH ring, was serially clamped cauterized and incised. The uterus was devascularized at this point.  The colpotomy was performed a starting in the midline and using a harmonic scalpel with the inferior edge of the open blade   This was carried around a circumferential fashion until the vaginal mucosa was completely incised in the specimen was freed.  The specimen was then delivered to the vagina.  A vaginal occlusive device was used to maintain the pneumoperitoneum  Instruments were changed with a needle driver and Kobra graspers.  Using a 9 inch V. lock suture, the cuff was closed by incorporating the anterior and posterior vaginal mucosa in each stitch. This was carried across all the way to the left corner and a running fashion. Two stitches were brought back towards the midline and the suture was cut flush with the vagina. The needle was brought out the pelvis. The pelvis was irrigated. All pedicles were inspected. There was a small amount of bleeding on the vaginal cuff which was made hemostatic with the ligasure device.  Co2 pressures were lowered to 8mm Hg.  Pelvis irrigated.  No bleeding was noted.  Ureters were noted deep in the pelvis to be peristalsing.  Arista was placed along the pedicles.  At this point the procedure was completed.  The remaining instruments were removed.  The ports (except the suprapubic port) were left in place but the pneumoperitoneum was relieved.  The patient was taken out of Trendelenburg positioning.  Several deep breaths were given  to the patient's trying to any gas the abdomen.  The Foley catheter was removed.  Cystoscopy was performed.  No sutures or bladder injuries were noted.  Left ureter was noted with normal urine jet being seen multiple times.  Foley was left out after the cystoscopic fluid was drained and cystoscope removed.  Attention was then turned the vagina and the cuff was inspected. No bleeding was noted. The anterior posterior vaginal mucosa was incorporated in each stitch.   Attention turned back to the abdomen.  Sterile gown and gloves were changed.  The skin incisions were then closed with subcuticular stitches of 3-0 Vicryl. The skin was cleansed Dermabond was applied.  Sponge, lap, needle, instrument counts were correct x2. Patient tolerated the procedure very well. She was awakened from anesthesia, extubated and taken to recovery in stable condition.    COUNTS:  YES  PLAN OF CARE: Transfer to PACU

## 2024-06-20 NOTE — Discharge Instructions (Addendum)
 Post Op Hysterectomy Instructions Please read the instructions below. Refer to these instructions for the next few weeks. These instructions provide you with general information on caring for yourself after surgery. Your caregiver may also give you specific instructions. While your treatment has been planned according to the most current medical practices available, unavoidable problems sometimes happen. If you have any problems or questions after you leave, please call your caregiver.  HOME CARE INSTRUCTIONS Healing will take time. You will have discomfort, tenderness, swelling and bruising at the operative site for a couple of weeks. This is normal and will get better as time goes on.  Only take over-the-counter or prescription medicines for pain, discomfort or fever as directed by your caregiver.  Do not take aspirin. It can cause bleeding.  Do not drive when taking narcotic pain medication.  Follow your caregiver's advice regarding diet, exercise, lifting, driving and general activities.  Resume your usual diet as directed and allowed.  Get plenty of rest and sleep.  Do not douche, use tampons, or have sexual intercourse until advised you can do so  Take your temperature if you feel hot or flushed.  You may shower today when you get home.  No tub bath for one week.   Do not drink alcohol until you are not taking any narcotic pain medications.  Try to have someone help you for the next week or two with the household activities.   Be careful over the next two to four weeks with any activities at home that involve lifting, pushing, or pulling.  Listen to your body--if something feels uncomfortable to do, then don't do it. Make sure you and your family understands everything about your operation and recovery.  Walking up stairs is fine. Do not sign any legal documents until you feel normal again.  Keep all your follow-up appointments as recommended by your caregiver.   PLEASE CALL THE OFFICE  IF: There is swelling, redness or increasing pain in the wound area.  Pus is coming from the wound.  You notice a bad smell from the wound or surgical dressing.  You have pain, redness and swelling from the intravenous site.  The wound is breaking open (the edges are not staying together).   You develop pain or bleeding when you urinate.  You develop abnormal vaginal discharge.  You have any type of abnormal reaction or develop an allergy to your medication.  You need stronger pain medication for your pain   SEEK IMMEDIATE MEDICAL CARE: You develop a temperature of 100.5 or higher.  You develop abdominal pain.  You develop chest pain.  You develop shortness of breath.  You pass out.  You develop pain, swelling or redness of your leg.  You develop heavy vaginal bleeding with or without blood clots.   MEDICATIONS: Restart your regular medications BUT wait one week before restarting all vitamins and mineral supplements to help ensure your bowel function is back to normal. Use Tylenol  500mg , 2 tablets (1gram) every 6 hours as well as the gabapentin  100mg  every 8 hours.  These are not narcotics and do not cause constipation. For more significant pain, use the Oxycodone  IR 5mg .  You can take 1 tablet every 4 hours as needed.  This can cause constipation. You may use an over the counter stool softener like Colace or Dulcolax to help with starting a bowel movement.  Start the day after you go home.  You can use miralax as well if you are having more issues  with constipation.  Warm liquids, fluids, and ambulation help too.  If you have not had a bowel movement in four days, you need to call the office.

## 2024-06-20 NOTE — Transfer of Care (Signed)
  Immediate Anesthesia Transfer of Care Note  Patient: Sarah George  Procedure(s) Performed: HYSTERECTOMY, TOTAL, LAPAROSCOPIC, WITH SALPINGECTOMY (Bilateral: Pelvis) CYSTOSCOPY (Bladder) EXCISION, CYST, OVARY, LAPAROSCOPIC (Right: Pelvis)  Patient Location: PACU  Anesthesia Type:General  Level of Consciousness: drowsy and patient cooperative  Airway & Oxygen Therapy: Patient Spontanous Breathing and Patient connected to face mask oxygen  Post-op Assessment: Report given to RN and Post -op Vital signs reviewed and stable  Post vital signs: Reviewed and stable  Last Vitals:  Vitals Value Taken Time  BP 117/75 06/20/24 14:19  Temp 36.6 C 06/20/24 14:19  Pulse 85 06/20/24 14:22  Resp 19 06/20/24 14:22  SpO2 100 % 06/20/24 14:22  Vitals shown include unfiled device data.  Last Pain:  Vitals:   06/20/24 1044  TempSrc:   PainSc: 0-No pain         Complications: No notable events documented.

## 2024-06-21 ENCOUNTER — Encounter (HOSPITAL_COMMUNITY): Payer: Self-pay | Admitting: Obstetrics & Gynecology

## 2024-06-21 LAB — TYPE AND SCREEN
ABO/RH(D): A POS
Antibody Screen: NEGATIVE

## 2024-06-21 NOTE — Anesthesia Postprocedure Evaluation (Signed)
 Anesthesia Post Note  Patient: Sarah George  Procedure(s) Performed: HYSTERECTOMY, TOTAL, LAPAROSCOPIC, WITH SALPINGECTOMY (Bilateral: Pelvis) CYSTOSCOPY (Bladder) EXCISION, CYST, OVARY, LAPAROSCOPIC (Right: Pelvis)     Patient location during evaluation: PACU Anesthesia Type: General Level of consciousness: awake and alert Pain management: pain level controlled Vital Signs Assessment: post-procedure vital signs reviewed and stable Respiratory status: spontaneous breathing, nonlabored ventilation, respiratory function stable and patient connected to nasal cannula oxygen Cardiovascular status: blood pressure returned to baseline and stable Postop Assessment: no apparent nausea or vomiting Anesthetic complications: no   No notable events documented.  Last Vitals:  Vitals:   06/20/24 1630 06/20/24 1645  BP: 119/81 109/80  Pulse: 73 72  Resp: 16 16  Temp:  36.6 C  SpO2: 100%     Last Pain:  Vitals:   06/20/24 1645  TempSrc:   PainSc: 0-No pain                 Epifanio Lamar BRAVO

## 2024-06-23 ENCOUNTER — Encounter (HOSPITAL_BASED_OUTPATIENT_CLINIC_OR_DEPARTMENT_OTHER): Payer: Self-pay | Admitting: Obstetrics & Gynecology

## 2024-06-23 LAB — SURGICAL PATHOLOGY

## 2024-06-24 ENCOUNTER — Other Ambulatory Visit (HOSPITAL_BASED_OUTPATIENT_CLINIC_OR_DEPARTMENT_OTHER): Payer: Self-pay | Admitting: Obstetrics & Gynecology

## 2024-06-24 DIAGNOSIS — L309 Dermatitis, unspecified: Secondary | ICD-10-CM

## 2024-06-24 MED ORDER — BETAMETHASONE DIPROPIONATE 0.05 % EX CREA: TOPICAL_CREAM | Freq: Two times a day (BID) | CUTANEOUS | 0 refills | Status: DC

## 2024-06-24 MED ORDER — HYDROXYZINE HCL 25 MG PO TABS: 25.0000 mg | ORAL_TABLET | Freq: Three times a day (TID) | ORAL | 0 refills | Status: DC | PRN

## 2024-06-27 ENCOUNTER — Ambulatory Visit (HOSPITAL_BASED_OUTPATIENT_CLINIC_OR_DEPARTMENT_OTHER): Payer: Self-pay | Admitting: Obstetrics & Gynecology

## 2024-06-29 ENCOUNTER — Ambulatory Visit (INDEPENDENT_AMBULATORY_CARE_PROVIDER_SITE_OTHER): Admitting: Obstetrics & Gynecology

## 2024-06-29 ENCOUNTER — Encounter (HOSPITAL_BASED_OUTPATIENT_CLINIC_OR_DEPARTMENT_OTHER): Payer: Self-pay | Admitting: Obstetrics & Gynecology

## 2024-06-29 ENCOUNTER — Other Ambulatory Visit (HOSPITAL_BASED_OUTPATIENT_CLINIC_OR_DEPARTMENT_OTHER): Payer: Self-pay

## 2024-06-29 VITALS — BP 125/76 | HR 89 | Wt 142.4 lb

## 2024-06-29 DIAGNOSIS — Z48816 Encounter for surgical aftercare following surgery on the genitourinary system: Secondary | ICD-10-CM

## 2024-06-29 DIAGNOSIS — Z90710 Acquired absence of both cervix and uterus: Secondary | ICD-10-CM

## 2024-06-29 DIAGNOSIS — Z9889 Other specified postprocedural states: Secondary | ICD-10-CM

## 2024-06-29 DIAGNOSIS — Z889 Allergy status to unspecified drugs, medicaments and biological substances status: Secondary | ICD-10-CM

## 2024-06-29 MED ORDER — ESTRADIOL 0.5 MG PO TABS: 0.2500 mg | ORAL_TABLET | Freq: Every day | ORAL | 1 refills | Status: AC

## 2024-06-29 MED ORDER — PREDNISONE 10 MG PO TABS
ORAL_TABLET | ORAL | 0 refills | Status: AC
  Filled 2024-06-29: qty 25, 10d supply, fill #0

## 2024-06-29 NOTE — Progress Notes (Signed)
 GYNECOLOGY  VISIT  CC:   post op recheck  HPI: 53 y.o. G0P0000 Single White or Caucasian female here for recheck after undergoing TLH w/Salpingectomy on 06/20/2024.  She reports bleeding is none.  She does report some spotting on toilet paper occasionally when she uses the bathroom.  She is only seeing pink and only occasionally.  She has no pain.  Never took narcotics.  Bowel function is Normal.  Bladder function is normal.    Patient reports that she did get a rash after surgery.    Pathology reviewed:  Yes .  Questions answered.    MEDS:   Current Outpatient Medications on File Prior to Visit  Medication Sig Dispense Refill   gabapentin  (NEURONTIN ) 100 MG capsule Take 1 capsule (100 mg total) by mouth 3 (three) times daily. 30 capsule 0   Ascorbic Acid (VITAMIN C DROPS MT) Place 2 drops under the tongue daily.     BERBERINE CHLORIDE PO Take 2 tablets by mouth daily.     betamethasone dipropionate 0.05 % cream Apply topically 2 (two) times daily. 45 g 0   Brimonidine Tartrate (LUMIFY OP) Place 1 drop into both eyes daily as needed (eye irritation, allergies).     Cyanocobalamin  (VITAMIN B-12 SL) Place 1 drop under the tongue daily as needed (energy).     hydrOXYzine (ATARAX) 25 MG tablet Take 1 tablet (25 mg total) by mouth every 8 (eight) hours as needed. 30 tablet 0   loratadine (CLARITIN) 10 MG tablet Take 10 mg by mouth daily as needed for allergies.     Multiple Vitamins-Minerals (MULTIVITAMIN WOMEN) TABS Take 1 tablet by mouth daily.     Omega-3 Fatty Acids (FISH OIL PO) Take 1 capsule by mouth daily.     OVER THE COUNTER MEDICATION Apply 1 spray topically See admin instructions. OTC magnesium topical spray - apply 1 spray to each foot at bedtime.     pantoprazole (PROTONIX) 40 MG tablet Take 40 mg by mouth daily as needed (acid reflux).     progesterone  (PROMETRIUM ) 100 MG capsule Take 1 capsule (100 mg total) by mouth at bedtime. 90 capsule 1   Propylene Glycol (SYSTANE COMPLETE  PF OP) Place 1 drop into both eyes daily.     TURMERIC CURCUMIN PO Place 2 drops under the tongue daily.     No current facility-administered medications on file prior to visit.    SH:  Smoking No    PHYSICAL EXAMINATION:    BP 125/76 (BP Location: Right Arm, Patient Position: Sitting, Cuff Size: Normal)   Pulse 89   Wt 142 lb 6.4 oz (64.6 kg)   LMP 11/17/2023   SpO2 100%   BMI 27.81 kg/m     General appearance: alert, cooperative and appears stated age CV:  Regular rate and rhythm Lungs:  clear to auscultation, no wheezes, rales or rhonchi, symmetric air entry Abdomen: soft, non-tender; bowel sounds normal; no masses,  no organomegaly Incisions:  C/D/I but erythematous blistery rash around incisions, spreading outwards from the incisions  Pelvic: deferred  Assessment/Plan: 1. Post-operative state (Primary) - recheck 4 weeks  2. H/O: hysterectomy  3. Atopic reaction - continue hydroxyzine at night and topical steroid cream.  Does not need new rx for this.  - predniSONE (DELTASONE) 10 MG tablet; 4 tablets (40 mg total) daily for 4 days, THEN 2 tablets (20 mg total) daily for 3 days, THEN 1 tablet (10 mg total) daily for 3 days.  Dispense: 25 tablet; Refill: 0

## 2024-07-01 ENCOUNTER — Encounter (HOSPITAL_BASED_OUTPATIENT_CLINIC_OR_DEPARTMENT_OTHER): Payer: Self-pay | Admitting: Obstetrics & Gynecology

## 2024-07-08 ENCOUNTER — Encounter (HOSPITAL_BASED_OUTPATIENT_CLINIC_OR_DEPARTMENT_OTHER): Payer: Self-pay

## 2024-07-08 ENCOUNTER — Ambulatory Visit (INDEPENDENT_AMBULATORY_CARE_PROVIDER_SITE_OTHER)

## 2024-07-08 VITALS — BP 148/94 | HR 96 | Ht 61.0 in | Wt 142.4 lb

## 2024-07-08 DIAGNOSIS — R3915 Urgency of urination: Secondary | ICD-10-CM

## 2024-07-08 LAB — POCT URINALYSIS DIPSTICK
Bilirubin, UA: NEGATIVE
Glucose, UA: NEGATIVE
Ketones, UA: NEGATIVE
Leukocytes, UA: NEGATIVE
Nitrite, UA: NEGATIVE
Protein, UA: NEGATIVE
Spec Grav, UA: 1.025 (ref 1.010–1.025)
Urobilinogen, UA: 0.2 U/dL
pH, UA: 5.5 (ref 5.0–8.0)

## 2024-07-08 NOTE — Progress Notes (Signed)
 NURSE VISIT- UTI SYMPTOMS   SUBJECTIVE:  Sarah George is a 53 y.o. G0P0000 female here for UTI symptoms. She is a GYN patient. She reports urinary urgency.  OBJECTIVE:  BP (!) 148/94   Pulse 96   Ht 5' 1 (1.549 m)   Wt 142 lb 6.4 oz (64.6 kg)   LMP 11/17/2023   BMI 26.91 kg/m   Appears well, in no apparent distress  Results for orders placed or performed in visit on 07/08/24 (from the past 24 hours)  POCT Urinalysis Dipstick   Collection Time: 07/08/24 11:41 AM  Result Value Ref Range   Color, UA Yellow    Clarity, UA Clear    Glucose, UA Negative Negative   Bilirubin, UA Negative    Ketones, UA Negative    Spec Grav, UA 1.025 1.010 - 1.025   Blood, UA Trace    pH, UA 5.5 5.0 - 8.0   Protein, UA Negative Negative   Urobilinogen, UA 0.2 0.2 or 1.0 E.U./dL   Nitrite, UA Negative    Leukocytes, UA Negative Negative   Appearance Yellow    Odor NA     ASSESSMENT: GYN patient with UTI symptoms and negative nitrites  PLAN: Visit routed to or discussed with:  Rx sent today: No Urine culture sent Call or return to clinic prn if these symptoms worsen or fail to improve as anticipated. Follow-up: as needed

## 2024-07-11 ENCOUNTER — Ambulatory Visit (HOSPITAL_BASED_OUTPATIENT_CLINIC_OR_DEPARTMENT_OTHER): Payer: Self-pay | Admitting: Obstetrics & Gynecology

## 2024-07-11 LAB — URINE CULTURE

## 2024-07-25 NOTE — Progress Notes (Signed)
 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 Report date is 07/18/2024.

## 2024-08-01 ENCOUNTER — Encounter (HOSPITAL_BASED_OUTPATIENT_CLINIC_OR_DEPARTMENT_OTHER): Admitting: Obstetrics & Gynecology

## 2024-08-02 ENCOUNTER — Encounter (HOSPITAL_BASED_OUTPATIENT_CLINIC_OR_DEPARTMENT_OTHER): Admitting: Obstetrics & Gynecology

## 2024-08-03 NOTE — Progress Notes (Unsigned)
° °  GYNECOLOGY  VISIT  CC:   No chief complaint on file.   HPI: 53 y.o. G0P0000 Single White or Caucasian female here for 5 week post op total laparoscopic hysterectomy with salpingectomy and excision of cyst on right ovary.  Patient's last menstrual period was 11/17/2023.  Past Medical History:  Diagnosis Date   Anemia    Anxiety    Deviated nasal septum    Family history of adverse reaction to anesthesia    mom with PONV   Fibroids, submucosal 04/14/2018   several fibroids measuring 8 x 8mm, 2.3 x 2.3cm, 8 x 6mm, and 10 x 7mm   GERD (gastroesophageal reflux disease)    History of abnormal Pap smear 09/1991   LEEP, CIN I   History of spider veins    Left cataract    History of    Lipoma 03/24/2014   Left supraclavicular   PONV (postoperative nausea and vomiting)    Renal cell carcinoma (HCC)    06/2023    MEDS:  Reviewed in EPIC  ALLERGIES: Other, Nsaids, Topamax [topiramate], and Dotti  [estradiol ]  SH:  ***  ROS  PHYSICAL EXAMINATION:    LMP 11/17/2023     General appearance: alert, cooperative and appears stated age Neck: no adenopathy, supple, symmetrical, trachea midline and thyroid  {CHL AMB PHY EX THYROID  NORM DEFAULT:[phone]::normal to inspection and palpation} CV:  {Exam; heart brief:31539} Lungs:  {pe lungs ob:314451} Breasts: {Exam; breast:13139::normal appearance, no masses or tenderness} Abdomen: soft, non-tender; bowel sounds normal; no masses,  no organomegaly Lymph:  no inguinal LAD noted  Pelvic: External genitalia:  no lesions              Urethra:  normal appearing urethra with no masses, tenderness or lesions              Bartholins and Skenes: normal                 Vagina: {exam; pelvic vaginal:30846}              Cervix: {CHL AMB PHY EX CERVIX NORM DEFAULT:[id number]::no lesions}              Bimanual Exam:  Uterus:  {CHL AMB PHY EX UTERUS NORM DEFAULT:[phone]::normal size, contour, position, consistency, mobility,  non-tender}              Adnexa: {CHL AMB PHY EX ADNEXA NO MASS DEFAULT:[phone]::no mass, fullness, tenderness}              Rectovaginal: {yes no:[id number]}.  Confirms.              Anus:  normal sphincter tone, no lesions  Chaperone was present for exam.  Assessment/Plan: There are no diagnoses linked to this encounter.

## 2024-08-04 ENCOUNTER — Encounter (HOSPITAL_BASED_OUTPATIENT_CLINIC_OR_DEPARTMENT_OTHER): Payer: Self-pay | Admitting: Obstetrics & Gynecology

## 2024-08-04 ENCOUNTER — Ambulatory Visit (INDEPENDENT_AMBULATORY_CARE_PROVIDER_SITE_OTHER): Admitting: Obstetrics & Gynecology

## 2024-08-04 VITALS — BP 128/82 | HR 52 | Wt 145.0 lb

## 2024-08-04 DIAGNOSIS — Z905 Acquired absence of kidney: Secondary | ICD-10-CM

## 2024-08-04 DIAGNOSIS — R7989 Other specified abnormal findings of blood chemistry: Secondary | ICD-10-CM

## 2024-08-04 DIAGNOSIS — Z9889 Other specified postprocedural states: Secondary | ICD-10-CM

## 2024-09-08 ENCOUNTER — Ambulatory Visit (INDEPENDENT_AMBULATORY_CARE_PROVIDER_SITE_OTHER): Payer: Self-pay | Admitting: Obstetrics & Gynecology

## 2024-09-08 ENCOUNTER — Encounter (HOSPITAL_BASED_OUTPATIENT_CLINIC_OR_DEPARTMENT_OTHER): Payer: Self-pay | Admitting: Obstetrics & Gynecology

## 2024-09-08 VITALS — BP 124/85 | HR 77 | Ht 61.0 in | Wt 147.8 lb

## 2024-09-08 DIAGNOSIS — Z48816 Encounter for surgical aftercare following surgery on the genitourinary system: Secondary | ICD-10-CM

## 2024-09-08 DIAGNOSIS — Z90710 Acquired absence of both cervix and uterus: Secondary | ICD-10-CM

## 2024-09-08 DIAGNOSIS — R944 Abnormal results of kidney function studies: Secondary | ICD-10-CM

## 2024-09-08 DIAGNOSIS — Z9889 Other specified postprocedural states: Secondary | ICD-10-CM

## 2024-09-08 DIAGNOSIS — Z6827 Body mass index (BMI) 27.0-27.9, adult: Secondary | ICD-10-CM

## 2024-09-08 MED ORDER — ESTRADIOL 0.01 % VA CREA: TOPICAL_CREAM | VAGINAL | 2 refills | Status: AC

## 2024-09-08 MED ORDER — PHENTERMINE HCL 37.5 MG PO CAPS: 37.5000 mg | ORAL_CAPSULE | ORAL | 0 refills | Status: AC

## 2024-09-08 NOTE — Progress Notes (Signed)
 GYNECOLOGY  VISIT  CC:   post op recheck  HPI: 54 y.o. G0P0000 Single White or Caucasian female here for recheck after undergoing  TLH/bilateral salpingectomy/cystoscopy on 11/3.  She reports bleeding is none.  She has no pain.  Bowel function is Normal.  Bladder function is normal.  She is frustrated with weight.  Has topamax and phentermine  she was given by previous provider to be similar to Qsymia.  Wants to review and see if ok to take.  Given current renal function, topamax dosage should not increase.  Could try higher dosage of phentermine .  She would like rx for this to try for the next month.  She is exercising.   MEDS:  Medications Ordered Prior to Encounter[1]  SH:  Smoking No    PHYSICAL EXAMINATION:    BP 124/85 (BP Location: Left Arm, Patient Position: Sitting, Cuff Size: Normal)   Pulse 77   Ht 5' 1 (1.549 m)   Wt 147 lb 12.8 oz (67 kg)   LMP 11/17/2023   SpO2 99%   BMI 27.93 kg/m     General appearance: alert, cooperative and appears stated age Abdomen: soft, non-tender; bowel sounds normal; no masses,  no organomegaly Incisions:  C/D/I  Pelvic: External genitalia:  no lesions              Urethra:  normal appearing urethra with no masses, tenderness or lesions              Bartholins and Skenes: normal                 Vagina: normal appearing vagina with normal color and discharge, no lesions, single blue suture still visualized              Cervix: absent              Bimanual Exam:  Uterus:  uterus absent              Adnexa: no mass, fullness, tenderness  Chaperone was present for exam.  Assessment/Plan: 1. Post-operative state (Primary) - recheck 1 month to be sure about vaginal   2. H/O: hysterectomy - small area of granulation tissue present - estradiol  (ESTRACE ) 0.01 % CREA vaginal cream; 1 gram vaginally twice weekly  Dispense: 42.5 g; Refill: 2  3. BMI 27.0-27.9,adult - will increase phentermine  and check weight 1 month.  If no change, will  stop.  Consider repeating CMP with [facility] at that time - phentermine  37.5 MG capsule; Take 1 capsule (37.5 mg total) by mouth every morning.  Dispense: 30 capsule; Refill: 0  4. Decreased calculated GFR - has been referred to nephrology.  Pt wanted specific names of providers.  She has already been called by the office        [1]  Current Outpatient Medications on File Prior to Visit  Medication Sig Dispense Refill   Ascorbic Acid (VITAMIN C DROPS MT) Place 2 drops under the tongue daily.     BERBERINE CHLORIDE PO Take 2 tablets by mouth daily.     Brimonidine Tartrate (LUMIFY OP) Place 1 drop into both eyes daily as needed (eye irritation, allergies).     Cyanocobalamin  (VITAMIN B-12 SL) Place 1 drop under the tongue daily as needed (energy).     estradiol  (ESTRACE ) 0.5 MG tablet Take 0.5 tablets (0.25 mg total) by mouth daily. 45 tablet 1   loratadine (CLARITIN) 10 MG tablet Take 10 mg by mouth daily as needed for allergies.  Multiple Vitamins-Minerals (MULTIVITAMIN WOMEN) TABS Take 1 tablet by mouth daily.     Omega-3 Fatty Acids (FISH OIL PO) Take 1 capsule by mouth daily.     OVER THE COUNTER MEDICATION Apply 1 spray topically See admin instructions. OTC magnesium topical spray - apply 1 spray to each foot at bedtime.     pantoprazole (PROTONIX) 40 MG tablet Take 40 mg by mouth daily as needed (acid reflux).     progesterone  (PROMETRIUM ) 100 MG capsule Take 1 capsule (100 mg total) by mouth at bedtime. 90 capsule 1   Propylene Glycol (SYSTANE COMPLETE PF OP) Place 1 drop into both eyes daily.     TURMERIC CURCUMIN PO Place 2 drops under the tongue daily.     No current facility-administered medications on file prior to visit.

## 2024-09-11 DIAGNOSIS — Z90710 Acquired absence of both cervix and uterus: Secondary | ICD-10-CM | POA: Insufficient documentation

## 2024-10-11 ENCOUNTER — Encounter (HOSPITAL_BASED_OUTPATIENT_CLINIC_OR_DEPARTMENT_OTHER): Payer: Self-pay | Admitting: Obstetrics & Gynecology
